# Patient Record
Sex: Male | Born: 1970 | Race: Black or African American | Hispanic: No | Marital: Single | Smoking: Never smoker
Health system: Southern US, Community
[De-identification: ages and names within clinical notes are randomized; demographics above are authoritative.]

## PROBLEM LIST (undated history)

## (undated) ENCOUNTER — Emergency Department: Admission: EM | Payer: Self-pay

## (undated) DIAGNOSIS — H209 Unspecified iridocyclitis: Secondary | ICD-10-CM

## (undated) DIAGNOSIS — A6929 Other conditions associated with Lyme disease: Secondary | ICD-10-CM

## (undated) DIAGNOSIS — M1990 Unspecified osteoarthritis, unspecified site: Secondary | ICD-10-CM

## (undated) HISTORY — PX: EYE SURGERY: SHX253

---

## 2006-07-20 ENCOUNTER — Emergency Department (HOSPITAL_COMMUNITY): Admission: EM | Admit: 2006-07-20 | Discharge: 2006-07-20 | Payer: Self-pay | Admitting: Emergency Medicine

## 2007-11-28 ENCOUNTER — Emergency Department (HOSPITAL_COMMUNITY): Admission: EM | Admit: 2007-11-28 | Discharge: 2007-11-28 | Payer: Self-pay | Admitting: Emergency Medicine

## 2009-07-15 ENCOUNTER — Emergency Department (HOSPITAL_COMMUNITY): Admission: EM | Admit: 2009-07-15 | Discharge: 2009-07-15 | Payer: Self-pay | Admitting: Emergency Medicine

## 2009-08-18 ENCOUNTER — Emergency Department (HOSPITAL_COMMUNITY): Admission: EM | Admit: 2009-08-18 | Discharge: 2009-08-18 | Payer: Self-pay | Admitting: Emergency Medicine

## 2010-12-30 LAB — DIFFERENTIAL
Basophils Relative: 1
Eosinophils Absolute: 0.3
Eosinophils Relative: 4
Monocytes Relative: 8
Neutrophils Relative %: 68

## 2010-12-30 LAB — CBC
MCHC: 33.9
MCV: 91.1
RBC: 4.24
RDW: 13.1

## 2010-12-30 LAB — BASIC METABOLIC PANEL
CO2: 29
Chloride: 104
GFR calc Af Amer: >60
Glucose, Bld: 78
Potassium: 3.5
Sodium: 139

## 2012-01-04 ENCOUNTER — Emergency Department (HOSPITAL_COMMUNITY)
Admission: EM | Admit: 2012-01-04 | Discharge: 2012-01-05 | Disposition: A | Discharge status: Home / Self Care | Payer: Self-pay | Attending: Emergency Medicine | Admitting: Emergency Medicine

## 2012-01-04 ENCOUNTER — Encounter (HOSPITAL_COMMUNITY): Payer: Self-pay | Admitting: *Deleted

## 2012-01-04 DIAGNOSIS — L02219 Cutaneous abscess of trunk, unspecified: Secondary | ICD-10-CM | POA: Insufficient documentation

## 2012-01-04 DIAGNOSIS — L02211 Cutaneous abscess of abdominal wall: Secondary | ICD-10-CM

## 2012-01-04 DIAGNOSIS — Z91013 Allergy to seafood: Secondary | ICD-10-CM | POA: Insufficient documentation

## 2012-01-04 DIAGNOSIS — L03119 Cellulitis of unspecified part of limb: Secondary | ICD-10-CM | POA: Insufficient documentation

## 2012-01-04 DIAGNOSIS — L02415 Cutaneous abscess of right lower limb: Secondary | ICD-10-CM

## 2012-01-04 DIAGNOSIS — L02419 Cutaneous abscess of limb, unspecified: Secondary | ICD-10-CM | POA: Insufficient documentation

## 2012-01-04 MED ORDER — SULFAMETHOXAZOLE-TMP DS 800-160 MG PO TABS
1.0000 | ORAL_TABLET | Freq: Once | ORAL | Status: AC
  Administered 2012-01-05: 1 via ORAL
  Filled 2012-01-04: qty 1

## 2012-01-04 MED ORDER — LIDOCAINE HCL (PF) 1 % IJ SOLN
10.0000 mL | Freq: Once | INTRAMUSCULAR | Status: AC
  Administered 2012-01-04: 5 mL via INTRADERMAL
  Filled 2012-01-04: qty 5

## 2012-01-04 MED ORDER — OXYCODONE-ACETAMINOPHEN 5-325 MG PO TABS
1.0000 | ORAL_TABLET | Freq: Once | ORAL | Status: AC
  Administered 2012-01-04: 1 via ORAL
  Filled 2012-01-04: qty 1

## 2012-01-04 MED ORDER — LIDOCAINE HCL (PF) 1 % IJ SOLN
INTRAMUSCULAR | Status: AC
  Administered 2012-01-04
  Filled 2012-01-04: qty 5

## 2012-01-04 NOTE — ED Notes (Signed)
States that he stayed in a

## 2012-01-04 NOTE — ED Notes (Signed)
Abscess to rt post thigh and to abd.

## 2012-01-04 NOTE — ED Provider Notes (Signed)
History     CSN: [id number]  Arrival date & time 01/04/12  2138   First MD Initiated Contact with Patient 01/04/12 2216      Chief Complaint  Patient presents with  . Abscess    (Consider location/radiation/quality/duration/timing/severity/associated sxs/prior treatment) HPI Comments: Patient c/o abscess to his right lower abdomen and right upper thigh that began several days ago.  Stats that he and his girlfriend recently stayed at a hotel prior to onset.  C/o pain , redness and swelling.  States he has tried to squeeze the one on his thigh w/o significant drainage.  He denies fever, chills, or vomiting  Patient is a 41 y.o. male presenting with abscess. The history is provided by the patient.  Abscess  This is a new problem. The current episode started less than one week ago. The onset was gradual. The problem occurs continuously. The problem has been gradually worsening. The abscess is present on the right upper leg and abdomen. The problem is moderate. The abscess is characterized by redness, painfulness and swelling. It is unknown what he was exposed to. The abscess first occurred at another residence. Pertinent negatives include no decrease in physical activity, no fever, no diarrhea, no vomiting and no decreased responsiveness. His past medical history is significant for skin abscesses in family. There were no sick contacts. He has received no recent medical care.    History reviewed. No pertinent past medical history.  History reviewed. No pertinent past surgical history.  History reviewed. No pertinent family history.  History  Substance Use Topics  . Smoking status: Never Smoker   . Smokeless tobacco: Not on file  . Alcohol Use: No      Review of Systems  Constitutional: Negative for fever, chills and decreased responsiveness.  Gastrointestinal: Negative for nausea, vomiting and diarrhea.  Musculoskeletal: Negative for joint swelling and arthralgias.  Skin: Positive  for color change.       Abscess   Hematological: Negative for adenopathy.  All other systems reviewed and are negative.    Allergies  Other and Shellfish allergy  Home Medications   Current Outpatient Rx  Name Route Sig Dispense Refill  . IBUPROFEN 200 MG PO TABS Oral Take 400-600 mg by mouth every 6 (six) hours as needed. For pain      BP 137/86  Pulse 86  Temp 98.6 F (37 C) (Oral)  Resp 18  Ht 5\' 7"  (1.702 m)  Wt 155 lb (70.308 kg)  BMI 24.28 kg/m2  SpO2 100%  Physical Exam  Nursing note and vitals reviewed. Constitutional: He is oriented to person, place, and time. He appears well-developed and well-nourished. No distress.  HENT:  Head: Normocephalic and atraumatic.  Cardiovascular: Normal rate, regular rhythm and normal heart sounds.   Pulmonary/Chest: Effort normal and breath sounds normal.  Abdominal: Soft. He exhibits no distension and no mass. There is no rebound and no guarding.       See skin exam  Musculoskeletal: Normal range of motion.  Neurological: He is alert and oriented to person, place, and time. He exhibits normal muscle tone. Coordination normal.  Skin: Skin is warm. There is erythema.          3 cm indurated abscess to the right medial thigh and 4 cm abscess to right lower abdominal wall.  Both have mild to moderate surrounding erythema    ED Course  Procedures (including critical care time)  Labs Reviewed - No data to display No results found.  MDM    INCISION AND DRAINAGE Performed by: Maxwell Caul. Consent: Verbal consent obtained. Risks and benefits: risks, benefits and alternatives were discussed Type: abscess  Body area: right lower abdomen  Anesthesia: local infiltration  Local anesthetic: lidocaine 1% w/o epinephrine  Anesthetic total: 3 ml  Complexity: complex Blunt dissection to break up loculations  Drainage: purulent  Drainage amount: moderate  Packing material: 1/4 in iodoform gauze  Patient  tolerance: Patient tolerated the procedure well with no immediate complications.    Abscess #2  INCISION AND DRAINAGE Performed by: Maxwell Caul. Consent: Verbal consent obtained. Risks and benefits: risks, benefits and alternatives were discussed Type: abscess  Body area: right thigh Anesthesia: local infiltration  Local anesthetic: lidocaine 1% w/o epinephrine  Anesthetic total: 3 ml  Complexity: complex Blunt dissection to break up loculations  Drainage: purulent  Drainage amount: moderate  Packing material: 1/4 in iodoform gauze  Patient tolerance: Patient tolerated the procedure well with no immediate complications.      Pt agrees to return here in 2 days for recheck and packing removal. First dose of abx given in ED.     The patient appears reasonably screened and/or stabilized for discharge and I doubt any other medical condition or other [facility] requiring further screening, evaluation, or treatment in the ED at this time prior to discharge.    Prescribed: Bactrim DS Percocet #15  Roy Tokarz L. Kidada Ging, [place] 01/05/12 0005

## 2012-01-05 MED ORDER — SULFAMETHOXAZOLE-TRIMETHOPRIM 800-160 MG PO TABS: 1.0000 | ORAL_TABLET | Freq: Two times a day (BID) | ORAL | Status: AC

## 2012-01-05 MED ORDER — OXYCODONE-ACETAMINOPHEN 5-325 MG PO TABS: 1.0000 | ORAL_TABLET | ORAL | Status: AC | PRN

## 2012-01-06 NOTE — ED Provider Notes (Signed)
Medical screening examination/treatment/procedure(s) were performed by non-physician practitioner and as supervising physician I was immediately available for consultation/collaboration.  Raeford Razor, MD 01/06/12 [phone]

## 2014-06-10 ENCOUNTER — Emergency Department
Admission: EM | Admit: 2014-06-10 | Discharge: 2014-06-10 | Disposition: A | Discharge status: Home / Self Care | Attending: Emergency Medicine | Admitting: Emergency Medicine

## 2014-06-10 ENCOUNTER — Encounter: Payer: Self-pay | Admitting: Emergency Medicine

## 2014-06-10 DIAGNOSIS — H22 Disorders of iris and ciliary body in diseases classified elsewhere: Secondary | ICD-10-CM | POA: Insufficient documentation

## 2014-06-10 DIAGNOSIS — H209 Unspecified iridocyclitis: Secondary | ICD-10-CM | POA: Insufficient documentation

## 2014-06-10 DIAGNOSIS — A6929 Other conditions associated with Lyme disease: Secondary | ICD-10-CM | POA: Insufficient documentation

## 2014-06-10 HISTORY — DX: Other conditions associated with Lyme disease: A69.29

## 2014-06-10 HISTORY — DX: Unspecified iridocyclitis: H20.9

## 2014-06-10 MED ORDER — ATROPINE 1 % EYE DROPS
1.0000 [drp] | Freq: Once | OPHTHALMIC | Status: AC
Start: 2014-06-10 — End: 2014-06-10
  Administered 2014-06-10: 1 [drp] via OPHTHALMIC
  Filled 2014-06-10: qty 5

## 2014-06-10 MED ORDER — ATROPINE 1% SUBLINGUAL DROPS (COMFORT CARE)
1.0000 [drp] | Freq: Two times a day (BID) | SUBLINGUAL | Status: AC
Start: 2014-06-10 — End: 2014-06-17

## 2014-06-10 MED ORDER — PREDNISOLONE ACETATE 1 % EYE DROPS,SUSPENSION
1.0000 [drp] | Freq: Once | OPHTHALMIC | Status: AC
Start: 2014-06-10 — End: 2014-06-10
  Administered 2014-06-10: 1 [drp] via OPHTHALMIC
  Filled 2014-06-10: qty 5

## 2014-06-10 MED ORDER — PREDNISOLONE ACETATE 1 % EYE DROPS,SUSPENSION
1.0000 [drp] | OPHTHALMIC | Status: AC
Start: 2014-06-10 — End: 2014-06-17

## 2014-06-10 NOTE — ED Triage Note (Signed)
OD blurry vision x2 wks, progressive,   Denies trauma  H/o lymes dx, iritis  +floaters/ha/light sensitivity

## 2014-06-10 NOTE — ED Initial Note (Signed)
EMERGENCY DEPARTMENT PHYSICIAN NOTE - Demarko Zeimet*       Date of Service:   06/10/2014  2:09 PM Patient's PCP: No primary care provider on file.   Note Started: 06/10/2014 14:36 DOB: [birth year]             Chief Complaint   Patient presents with    Vision Loss Full or Partial Field, Chronic           The history provided by the patient and police.  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 Associated symptoms include headache, floaters, photophobia and blurry vision.  No fever, chills, joint pain, chest pain, or abdominal pain.  In the past, patient has taken Prednisone for this issue.   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    Pt had bad reaction to prednisone in the past (gained 15#) and hopes to not be on it again.    A full history, including pertinent past medical, family and social history was reviewed.    HISTORY:  There are no hospital problems to display for this patient.   No Known Allergies   Past Medical History:    Lyme uveitis                                               No past surgical history on file.   Social History    Marital Status: SINGLE              Spouse Name:                       Years of Education:                 Number of children:               Occupational History    None on file    Social History Main Topics    Smoking Status: Not on file                       Smokeless Status: Not on file                       Alcohol Use: Not on file     Drug Use: Not on file     Sexual Activity: Not on file          Other Topics            Concern    None on file    Social History Narrative    None on file     No family history on file.             Review of Systems   Constitutional: Negative for fever and  chills.   Eyes: Positive for pain.        Positive floaters and blurry vision.   Cardiovascular: Negative for chest pain.   Gastrointestinal: Negative for abdominal pain.   Musculoskeletal: Negative for arthralgias.  Neurological: Positive for headaches.   All other systems reviewed and are negative.      TRIAGE VITAL SIGNS:  Temp: 36.7 C (98.1 F) (06/10/14 1406)  Temp src: Oral (06/10/14 1406)  Pulse: 84 (06/10/14 1406)  BP: (!) 140/102 mmHg (06/10/14 1406)  Resp: 16 (06/10/14 1406)  SpO2: 99 % (06/10/14 1406)  Weight: (not recorded)    Physical Exam   Constitutional: He is oriented to person, place, and time. He appears well-developed and well-nourished.   HENT:   Head: Normocephalic.   Right Ear: External ear normal.   Left Ear: External ear normal.   Nose: Nose normal.   Eyes: Right eye exhibits no discharge. Left eye exhibits no discharge.   R pupil approximately 5mm.  L pupil 3 mm.  Right minimally reactive to light.  R Anterior chamber hazy. No erythema.  No conjunctival injection.  No hyphema.   Neck: Neck supple. No JVD present. No tracheal deviation present.   Cardiovascular: Normal rate.    Pulmonary/Chest: Effort normal. No respiratory distress.   Abdominal: He exhibits no distension.   Musculoskeletal: Normal range of motion.   Neurological: He is alert and oriented to person, place, and time. He exhibits normal muscle tone.   Skin: Skin is warm and dry. No rash noted.   Psychiatric: He has a normal mood and affect. His behavior is normal.   Nursing note and vitals reviewed.          INITIAL ASSESSMENT & PLAN, MEDICAL DECISION MAKING, ED COURSE:  Bacilio Abascal* is a 44yr old male who presents with a chief complaint of right eye pain with floaters and blurry vision. 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 Differential diagnosis includes, but is not limited to traumatic iritis, rheumatic process, inflammatory or infectious process.  The patient is  hemodynamically stable and will require optho eval and predforte drops as an immediate intervention. Initial treatment and studies to evaluate this problem will include consultation.         ED Course Update: The patient was observed in the ED. The results of the ED evaluation were notable for the following:    Consults: A Consult was obtained from the Opthalmology service to evaluate for uveitis. They recommend predforte and atropine. 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  Consistent with lyme uveitis. Discussed with optho and will treat with predforte and atropine.  Ok for [employer] with optho follow up within 2 days.        LAST VITAL SIGNS:  Temp: 36.7 C (98.1 F) (06/10/14 1406)  Temp src: Oral (06/10/14 1406)  Pulse: 84 (06/10/14 1406)  BP: (!) 140/102 mmHg (06/10/14 1406)  Resp: 16 (06/10/14 1406)  SpO2: 99 % (06/10/14 1406)  Weight: (not recorded)      Clinical Impression: acute recurrent lyme uveitis      Anticipated work up needed: optho fu      Disposition: Discharge. Follow up with PCP. ED discharge instructions were reviewed and provided. It was discussed that radiology reports are preliminary and the patient will be contacted for any changes in interpretation.        MDM COMPLEXITY  Overall Complexity of MDM is: high          PRESENT ON ADMISSION:  Are any of the following  four conditions present or suspected on admission: decubitus ulcer, infection from an intravascular device, infection due to an indwelling catheter, surgical site infection or pneumonia? No.    PATIENT'S GENERAL CONDITION:  Good: Vital signs are stable and within normal limits. Patient is conscious and comfortable. Indicators are excellent.     SCRIBE STATEMENT  I, Earlene Platererek Clary, SCRIBE,  am personally taking down the notes in the presence of Dr. Levonne HubertJenny  Elric Tirado.  Electronically signed by - Earlene Platererek Clary, SCRIBE, Scribe    This report was electronically signed by myself, Levonne HubertJenny Edmund Holcomb MD, attending physician.

## 2014-06-10 NOTE — Discharge Instructions (Signed)
Please take the predforte drops: 1 drop every 2 ours while awake.  Take the atopine: 1-2 drops twice a day.  You must followup with an opthalmologist in 1-2 days for reevaluation.  Return for any increasing pain, fevers, new concerns

## 2019-12-24 ENCOUNTER — Emergency Department
Admission: EM | Admit: 2019-12-24 | Discharge: 2019-12-24 | Disposition: A | Discharge status: Home / Self Care | Payer: MEDICAID | Attending: Emergency Medicine | Admitting: Emergency Medicine

## 2019-12-24 ENCOUNTER — Emergency Department (EMERGENCY_DEPARTMENT_HOSPITAL): Payer: MEDICAID

## 2019-12-24 ENCOUNTER — Encounter: Payer: Self-pay | Admitting: EMERGENCY MEDICINE

## 2019-12-24 DIAGNOSIS — H209 Unspecified iridocyclitis: Secondary | ICD-10-CM

## 2019-12-24 DIAGNOSIS — H5712 Ocular pain, left eye: Secondary | ICD-10-CM

## 2019-12-24 DIAGNOSIS — H44113 Panuveitis, bilateral: Secondary | ICD-10-CM

## 2019-12-24 HISTORY — DX: Unspecified iridocyclitis: H20.9

## 2019-12-24 LAB — COMPREHENSIVE METABOLIC PANEL
Alanine Transferase (ALT): 15 U/L (ref 6–63)
Albumin: 3.8 g/dL (ref 3.4–4.8)
Alkaline Phosphatase (ALP): 59 U/L (ref 35–115)
Aspartate Transaminase (AST): 24 U/L (ref 15–43)
Bilirubin Total: 0.7 mg/dL (ref 0.3–1.3)
Calcium: 9.2 mg/dL (ref 8.6–10.5)
Carbon Dioxide Total: 26 mmol/L (ref 24–32)
Chloride: 101 mmol/L (ref 95–110)
Creatinine Serum: 1.01 mg/dL (ref 0.44–1.27)
E-GFR Creatinine (Male): 88 mL/min/{1.73_m2}
Glucose: 80 mg/dL (ref 70–99)
Potassium: 3.9 mmol/L (ref 3.3–5.0)
Protein: 7 g/dL (ref 6.3–8.3)
Sodium: 137 mmol/L (ref 135–145)
Urea Nitrogen, Blood (BUN): 13 mg/dL (ref 8–22)

## 2019-12-24 LAB — CBC WITH DIFFERENTIAL
Basophils % Auto: 0.5 %
Basophils Abs Auto: 0 10*3/uL (ref 0.0–0.2)
Eosinophils % Auto: 6.8 %
Eosinophils Abs Auto: 0.5 10*3/uL (ref 0.0–0.5)
Hematocrit: 42.4 % (ref 40.0–52.0)
Hemoglobin: 14.1 g/dL (ref 14.0–18.0)
Lymphocytes % Auto: 20.2 %
Lymphocytes Abs Auto: 1.4 10*3/uL (ref 1.0–4.8)
MCH: 30.1 pg (ref 27.0–33.0)
MCHC: 33.3 % (ref 32.0–36.0)
MCV: 90.3 fL (ref 80.0–100.0)
MPV: 8.9 fL (ref 6.8–10.0)
Monocytes % Auto: 7.4 %
Monocytes Abs Auto: 0.5 10*3/uL (ref 0.1–0.8)
Neutrophils % Auto: 65.1 %
Neutrophils Abs Auto: 4.5 10*3/uL (ref 1.8–7.7)
Platelet Count: 229 10*3/uL (ref 130–400)
RDW: 13.4 % (ref 0.0–14.7)
Red Blood Cell Count: 4.7 10*6/uL (ref 4.50–5.90)
White Blood Cell Count: 6.9 10*3/uL (ref 4.5–11.0)

## 2019-12-24 LAB — HIV AG/AB COMBO SCREEN: HIV Ag/Ab Combo Screen: NONREACTIVE

## 2019-12-24 LAB — ED HCV SCREEN WITH REFLEX: Hepatitis C Ab Screen: NONREACTIVE

## 2019-12-24 MED ORDER — PREDNISOLONE ACETATE 1 % EYE DROPS,SUSPENSION
1.0000 [drp] | OPHTHALMIC | 0 refills | Status: AC | PRN
Start: 2019-12-24 — End: 2020-01-23

## 2019-12-24 MED ORDER — CYCLOPENTOLATE 0.5 % EYE DROPS
1.0000 [drp] | Freq: Two times a day (BID) | OPHTHALMIC | 0 refills | Status: AC
Start: 2019-12-24 — End: 2020-01-23

## 2019-12-24 MED ORDER — TIMOLOL MALEATE 0.5 % EYE DROPS
1.0000 [drp] | Freq: Two times a day (BID) | OPHTHALMIC | 0 refills | Status: AC
Start: 2019-12-24 — End: 2020-02-18

## 2019-12-24 NOTE — Discharge Instructions (Signed)
You were evaluated and treated today for eye problems.   You were evaluated by the Ophthalmologist today, they recommend:  --Pred Forte eye drops as directed  --Timolol Eye drops as directed  --Cyclopentolate eye drops as directed  Please review  result with your Primary Doctor as it may require further testing and/or treatment.  Follow up with your Primary Doctor in 2 days.   If you are not able to arrange timely follow-up with a primary doctor yourself, please call [place] representative at [phone] during regular business hours for assistance in obtaining a follow-up appointment.  Return to ER for any worsening symptoms, including fevers, worsening pain, persistent vomiting, difficulty breathing, new rash, new/sudden vision changes, or other concerning symptoms.        Your Results this visit:   Please review with your Doctor as further testing/treatment may be indicated.  Labs Reviewed   CBC WITH DIFFERENTIAL   COMPREHENSIVE METABOLIC PANEL   SYPHILIS IGG/IGM AB WITH REFLEX   QUANTIFERON,TB GOLD PLUS   ANGIOTENSIN CONVERT ENZ (ACE) SENDOUT   HLA-B27 SENDOUT   BETA-2 MICROGLOBULIN,URINE SENDOUT   ED HCV SCREEN WITH REFLEX   COVID-2019 RNA, QUAL   HIV AG/AB COMBO SCREEN       Chest Xray  Results pending      Thank you for choosing [facility] for your emergency health care needs. It has been our privilege to take care of you today. Your primary complaints have been evaluated based on your history, lab tests, imaging tests and you have been treated for your symptoms and discharged home. Please take all medicines that are prescribed to you as directed (see below).  It is crucial to follow up with your Primary Doctor in the time frame recommended as many health conditions that seem self-limited initially may actually worsen over time.  If you do not have a primary care physician, we will outline the various resources available for you to find one.    If at any time you  feel that your condition is worsening, call your doctor or return to the Emergency Department for reevaluation. CALL 911 IF YOU THINK YOU ARE HAVING A MEDICAL EMERGENCY. Return to the Emergency Department if you are unable to obtain the recommended follow-up treatment or you are not better as expected. You can call the [facility] with questions at [phone].    Please realize that the results of some studies that you had done during your stay with [place] (such as x-rays and cultures) have only preliminarily results at this time.  Results of these studies may change as more information becomes available or as the studies are re-evaluated by other members of our health care team in the next few days. We will attempt to contact you with any important changes or additions to the studies that were obtained today, particularly if any of these results require a change in your treatment.    It is your responsibility to review any and all studies performed this visit with your Primary Doctor as soon as possible, as they may provide additional important information regarding your health. If your Primary doctor if outside of the [facility] system it is recommended you obtain your records through the Medical Records Department (phone: [phone]).        GENERAL PAIN MEDICATION PRECAUTIONS    You may have been prescribed medications for pain today. Some of these may contain a narcotic (Vicodin, Norco, Percocet, etc.) Take these pain  medications as prescribed. You also may have been prescribed a muscle relaxant or anxiolytic (benzodiazepine) such as valium (diazepam) or ativan (lorazepam). Do not drink alcohol, drive, or operate heavy machinery while taking either narcotic or benzodiazepine medications. All narcotics and benzodiazepines have a risk of dependence, so please use with caution. If you were prescribed Vicodin, Norco, or Percocet, do not take additional products containing Tylenol (acetaminophen), as this can  cause an acetaminophen overdose and can damage your liver. Do not take more than 4061m of acetaminophen daily.    You may also have been prescribed an anti-inflammatory pain medication (NSAID) such as ibuprofen (Motrin, Advil) or naproxen (Aleve). Take the NSAID as prescribed, with food or milk.  Stop taking the NSAID if you develop abdominal pain, vomiting blood or dark/tarry or bloody stools. Be sure to drink plenty of fluids, at least 1-2 liters of water per day while taking an NSAID unless you have congestive heart failure or other condition that requires you to limit your water intake.    You may fill any prescriptions at a pharmacy of your choice. Please take these as directed.      =================================================    [facility]  [address] of [place]  Clinic Address: 1st Floor, [address]  Enrollment: 2nd Floor, [place] [address] Clinic Telephone: [phone]   If you do NOT have any health insurance or have Emergency Medi-Cal only, you may qualify for the [facility]"Healthy Partners" Program to get primary care. Follow the same instructions above and ask to sign up for the Healthy Partners program.      You can call your managed care program to help find a different clinic.     Managed Care Program phone numbers:  -[place]: [phone] [phone] - [place]: [phone] - Health Net: 8[place] [phone]   Here is a list of local Medi-Cal or low-cost clinics other than the [facility] Call the clinic to check what type of insurance they accept:    WK[place]  Call Center: 9[place] --> If you are undocumented and have Emergency Medi-Cal only or are uninsured, you can be seen here in the Immediate (Urgent) Care Clinic.  [address] [place] [address] Phone:  9[place] [address], [address] [phone] [phone] [address] [place] [id number] [phone]. [place] [phone]  All clinics open 8 am - 5:30 pm for appointments only.  [address] [place] [phone]  [address] [place] [phone]  [address], [place] [phone]  [place]ie [address] [place] [phone]  [address] [phone]  [address] [place] [address] [address] [address], [place] [phone]  [address] [place] [phone]    Health For All Clinics:  These clinics do NOT prescribe pain  medications.  [facility]  [address], [place], [address] [phone] or [phone]  [address] [place], [place], [address] [phone]  [facility]  [address], [place], [place], [address] [phone]    [facility]  [address] [place], [place], [address] [phone]  M-F 8:30am-12 and 1:30pm -4:30pm    [facility] Urgent [facility]  [address] [place], [place], [address] [phone]  M-F 9am-9pm  Weekends and holidays 9am-5pm  Walk in urgent care only, no primary care assignment available    [facility] [phone]  [address] [place], [place]  [place], [place] [address]  [facility]  [place],  [place] [address]  [place], [place] [address]  [facility]  [address]  [place], [place] [address]  [phone]    =============================================    HOW TO APPLY FOR MEDI-CAL INSURANCE    You can apply for Medi-Cal in person at the following locations:    1. [address],  [place], [place] [address]  [facility]: [phone]  Lobby Hours: Monday - Friday, 8:00am to 4:00pm    Program(s):  CalFresh &Medi-Cal [place] only  General Assistance  Job Talk Session (every Wednesday at 1:30pm)    2. [address], [place] [address]  [place]: [phone]  Hours of Operation: Monday - Friday, 8:00am to 5:00pm    Program(s):  CalFresh and Medi-Cal (Continuing) [place] only.  Documents can be put in a drop box in the parking lot.    For assistance with applying by phone or mailor to request a new Medi-Cal card,you can contact the [place] at [phone] [facility] only)      [facility] 24 hour Pharmacy Services    [facility]  [address] [place] (will fill [place] after hours)  [address], [facility]   [phone]    [facility]  [address] [place] [address]  [phone]     [facility] (7am-11pm)  [address], [place] [address]   [place] for [facility], [address] [place], [place]  [phone], [address], [place], [place] [phone] (Sunday 8a-2p)    (Last updated 08/16/15)

## 2019-12-24 NOTE — Allied Health Progress (Signed)
ED IN-HOUSE NOTE: HEALTH NAVIGATOR ASSESSMENT:     Demographics     Patient Name  Terry Stuart    MRN  [id number]    Date of Visit  12/24/2019     Primary Service  Emergency Medicine    Payor  Payor: BLUE CROSS [place] / Plan: BLUE CROSS/[employer] / Product Type: *No Product type* /      1. Referral Source:Epic Consult  2. If ED consult, is this consult assigned as "Urgent" or "Routine" by referring provider?   Routine  3. Concerns/needs/requests for Health Navigator assistance include: Coordinate follow-up appointments  4. Registration/Insurance confirmed:Yes  5. Refer to external health navigator? no  6.   MyChart registration confirmed: yes  7.   Interpreter needed: NO If so, language of choice: English            Referral [place]/  External? Date   Scheduled Appt Location/  Provider Name Outcome Date Completed  (if applicable)   PCP             MD Specialist 1                  Substance Use [place]                  Other                    1. Were you able to contact the patient?  yes If not, state the reason: N/A (Patient Contacted)  2. Reviewed reason for referral to [employer].  Obtained patient verbal agreement to         participate in program. Discussed the right to decline services at any time and understanding that there will be follow-up by [facility] Navigator.  3. Preferred days/times for outreach: UNSPECIFIED  4. Best phone number verified as: [phone] OK to leave detailed message at this phone number?  Unknown  5. Patient utilizes MyChart for communication: no  6. Any In-House Referrals Needed?  a. N/A  7. [employer] Performed (details included such as follow ups that are needed):  a. N/A    HN met with pt whom expressed will be flying out to [facility] and will most likely be gone for the next four months. Pt has an ophthalmologist in [place] and can follow up there. HN provided pt with pcp information. Pt will be  scheduling appointment with new pcp and asking for new optho referral. No further HN assistance needed at this time; HN ending episode.    I spoke with the patient for  40-50 minutes and spent 10-20 minutes following up on the notes items.     Electronically signed by:  Alinda Deem, [place] Navigator Program

## 2019-12-24 NOTE — Consults (Signed)
Met with Patient per Consult order.  Reference Allied Health Progress Note.    Electronically signed by:  Luisa Ayala Naranjo, Health Services Navigator  TOC Health Navigator    Transitions of Care   Health Navigator Program  [phone]

## 2019-12-24 NOTE — ED Triage Note (Signed)
Pt ambulated to triage, reports recent change in doctors and here to establish care with opthomolgy, reports needs new eye drops. Denies any new c/o.  Reports has light sensitivity and blurred vision d/t being out of drops.
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
## 2019-12-24 NOTE — ED Provider Notes (Signed)
EMERGENCY DEPARTMENT PHYSICIAN NOTE - Terry Stuart         Date of Service:   12/24/2019  1:17 PM Patient's PCP: Patient, No Pcp Per   Note Started: 12/24/2019 15:16 DOB: 10-21-1970         Chief Complaint   Patient presents with    Eye Problem     med scrip refill          The history provided by the patient.  Interpreter used: No    Terry Stuart is a 49yr old male, who  has a past medical history of Iritis of left eye., presenting to the ED with a chief complaint of L eye pain this is dull, constant, progressive, without alleviating or exacerbating factors and redness that began in the last 24 hours. This feels similar to his previous flares of iritis. He normally takes steroids and other medications but ran out of his eyedrops and his old ophthalmologist retired; he is attempting to establish care here. No trauma. No fevers, chills, vomiting, headache. No other complaints.     A full history, including past medical, social, and family history (as detailed in this note), was reviewed and updated as necessary.      HISTORY:    Past Medical History    Iritis of left eye    Allergies   Allergen Reactions    Seafood Hives      No past surgical history on file. No current outpatient medications on file.   Social History     Tobacco Use    Smoking status: Not on file   Substance Use Topics    Alcohol use: Not on file    Drug use: Not on file     Social History     Social History Narrative    Not on file    No family history on file.        Review of Systems   Constitutional: Negative for activity change and fever.   HENT: Negative for rhinorrhea and sore throat.    Eyes: Positive for pain and redness. Negative for visual disturbance.   Respiratory: Negative for cough and shortness of breath.    Cardiovascular: Negative for chest pain and leg swelling.   Gastrointestinal: Negative for abdominal distention, abdominal pain, constipation, diarrhea and nausea.   Genitourinary: Negative for dysuria and  hematuria.   Musculoskeletal: Negative for arthralgias, back pain, neck pain and neck stiffness.   Skin: Negative for rash.   Neurological: Negative for dizziness, light-headedness and headaches.   Psychiatric/Behavioral: Negative for agitation and dysphoric mood.          TRIAGE VITAL SIGNS:  Temp: 36.5 C (97.7 F) (12/24/19 1319)  Temp src: Oral (12/24/19 1319)  Pulse: 68 (12/24/19 1319)  BP: (!) 145/94 (12/24/19 1319)  Resp: 20 (12/24/19 1319)  SpO2: 99 % (12/24/19 1319)  Weight: 77.6 kg (171 lb 1.2 oz) (12/24/19 1319)    Physical Exam  Vitals and nursing note reviewed.   Constitutional:       Appearance: He is not ill-appearing.   HENT:      Head: Normocephalic and atraumatic.      Right Ear: External ear normal.      Left Ear: External ear normal.      Nose: Nose normal.   Eyes:      Comments: Right eye: Normal extraocular movements, pupils 4 mm and reactive, no scleral injection  Left eye: Normal extraocular movements, pupil 3 mm and slightly misshapened, injected  sclera   Cardiovascular:      Rate and Rhythm: Normal rate and regular rhythm.   Pulmonary:      Effort: Pulmonary effort is normal. No respiratory distress.   Abdominal:      General: There is no distension.      Palpations: Abdomen is soft.      Tenderness: There is no abdominal tenderness.   Musculoskeletal:      Right lower leg: No edema.      Left lower leg: No edema.   Skin:     General: Skin is warm and dry.   Neurological:      General: No focal deficit present.      Mental Status: He is alert and oriented to person, place, and time.   Psychiatric:         Mood and Affect: Mood normal.              INITIAL ASSESSMENT & PLAN, MEDICAL DECISION MAKING, ED COURSE  Terry Stuart is a 38yr male who presents with a chief complaint of L eye pain.     Differential includes, but is not limited to: uveitis, iritis, trauma     The results of the ED evaluation were notable for the following:     Consults:  [place]  Reason for consult: L eye pain        Chart Review: I reviewed the patient's prior medical records. Pertinent information that is relevant to this encounter: ER visit from [place] from 06/10/2014          PATIENT SUMMARY:   Terry Stuart is a 49yr old male, who  has a past medical history of Iritis of left eye., presenting to the ED with a chief complaint of L eye pain this is dull, constant, progressive, without alleviating or exacerbating factors and redness that began in the last 24 hours. Similar to prior iritis flares.    Initial VS unremarkable and reassuring.  Eye exam as above.    Patient with previously documented anisocoria back in 2016.     Ophthalmology consulted.  They ordered a full panel of labs including beta-2 microglobulin, ACE, syphilis, CBC, CMP, quantiferon gold, HLA-B27. Ophtho requests followup in Ophtho Uveitis clinic.    Chest x-ray without acute disease.    Given prescriptions for Pred forte, timolol and cyclopentolate drops.    ED health navigator consulted to arrange for ophthalmology (uveitis) follow-up. Of note, pt stating he is going to travel back to [place] imminently and is not sure when he will return here, understands to see ophtho as soon as he gets there for ongoing care. Call Optho clinic when he returns to [facility] area to re-establish here when needed.             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 Follow up with ophthalmology. ED discharge instructions were reviewed and provided.      PATIENT'S GENERAL CONDITION:  Fair: Vital signs are stable and within normal limits. Patient is conscious but may be uncomfortable. Indicators are favorable.    Electronically  signed by:  Sedonia Small, MD, Resident              A medical screening exam was performed.    This patient was seen, evaluated, and care plan was developed with the resident.  I agree with the findings and plan as outlined in our combined note.    Terry Cooks, MD      Electronically signed by: Terry Cooks, MD, Attending Physician

## 2019-12-25 LAB — COVID-2019 RNA, QUAL: SARS-CoV-2: NOT DETECTED

## 2019-12-25 LAB — SYPHILIS IGG/IGM AB WITH REFLEX: Syphilis IgG/IgM Ab with Reflex: NONREACTIVE

## 2019-12-26 LAB — QUANTIFERON,TB GOLD PLUS
Mit - Nil: 8.81 IU/mL
Nil: 0.01 IU/mL
QFT (Qual): NEGATIVE
TB1 - Nil: 0 IU/mL
TB2 - Nil: 0 IU/mL

## 2019-12-26 LAB — ANGIOTENSIN CONVERT ENZ (ACE) SENDOUT: Angiotensin Convert Enz (ACE): 38 U/L (ref 9–67)

## 2019-12-27 LAB — BETA-2 MICROGLOBULIN,URINE SENDOUT
BETA-2 MICROGLOB URINE ug/gCRT: 41 ug/g CRT (ref 0–300)
BETA-2 MICROGLOBULIN,URINE: 37 ug/L (ref 0–300)
CREATININE,UR PER VOLUME: 89 mg/dL
pH METABOLIC SCREEN: 7

## 2019-12-27 LAB — HLA-B27 SENDOUT: HLA-B27: POSITIVE — AB

## 2020-01-09 ENCOUNTER — Other Ambulatory Visit: Payer: Self-pay | Admitting: Emergency Medicine

## 2020-02-04 ENCOUNTER — Other Ambulatory Visit: Payer: Self-pay | Admitting: Emergency Medicine

## 2020-02-11 ENCOUNTER — Other Ambulatory Visit: Payer: Self-pay | Admitting: Emergency Medicine

## 2020-02-18 ENCOUNTER — Ambulatory Visit: Payer: MEDICAID | Attending: OPHTHALMOLOGY | Admitting: OPHTHALMOLOGY

## 2020-02-18 DIAGNOSIS — H44113 Panuveitis, bilateral: Secondary | ICD-10-CM | POA: Insufficient documentation

## 2020-02-18 DIAGNOSIS — H40003 Preglaucoma, unspecified, bilateral: Secondary | ICD-10-CM

## 2020-02-18 DIAGNOSIS — Z961 Presence of intraocular lens: Secondary | ICD-10-CM

## 2020-02-18 MED ORDER — PREDNISOLONE ACETATE 1 % EYE DROPS,SUSPENSION
1.0000 [drp] | Freq: Every day | OPHTHALMIC | 5 refills | Status: DC
Start: 2020-02-18 — End: 2020-02-19

## 2020-02-18 MED ORDER — PREDNISONE 10 MG TABLET
10.0000 mg | ORAL_TABLET | Freq: Every day | ORAL | 0 refills | Status: DC
Start: 2020-02-18 — End: 2020-02-19
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
## 2020-02-18 NOTE — Progress Notes (Signed)
Fluorescein Angiography Nursing Note:  Procedure consent obtained and patient review of potential side effects of nausea, dizziness, itching, pain, urine and skin discoloration by  Dr. Bufford Buttner prior to procedure. Patient's diabetic, blood pressure, cardiac, allergy status reviewed by nurse. Pre angio VS @ 12:25 PM with L cuff, L arm.  BP 117/112 P 60, R 16. Site cleansed prior to insertion. 500mg /66ml of Fluorescein given IVP via 25g butterfly to LAC vein. Initially, good blood return noted and flushed with 1 ml normal saline. Flushed with NS 9 ml post infusion. Dry dressing to area after pressure applied. Patient tolerated procedure well. Denied SOB, pain, faintness, itching, nausea. Post vitals @  BP 137/96, P 66, R 16. Pt ambulated post procedure with no complaints when asked.      4m, RN

## 2020-02-18 NOTE — Nursing Note (Signed)
Patient was wearing a surgical mask.  Droplet precautions were followed when caring for the patient.   PPE used by me during encounter: Surgical mask     Coady Train, HUS[facility]

## 2020-02-18 NOTE — Progress Notes (Deleted)
HPI 02/18/2020:    Terry Stuart is a pleasant 49yr old male here for first visit with me =    Past Medical History:   Diagnosis Date    Iritis of left eye     chronic, recurrent       ROS:  Gen: No acute distress    Base Eye Exam     Visual Acuity (Snellen - Linear)       Right Left    Dist sc 20/50 -2 20/70    Dist ph sc 20/25 -2 20/50          Tonometry ([facility], 10:31 AM)       Right Left    Pressure 11 10          Pupils       Dark Shape React    Right 3  +    Left 3 Irregular +          Visual Fields       Right Left     Full Full          Extraocular Movement       Right Left     Full Full          Neuro/Psych     Oriented x3: Yes    Mood/Affect: Normal          Dilation     Both eyes: 1.0% Mydriacyl, 2.5% Phenylephrine @ 10:31 AM   Sunglasses were offered to patient today but declined.  Patient states brought sunglasses.  Terry Stuart HLT I                   Data Reviewed:  OCT macula 02/18/2020:  ***    Injection schedule  OD or [id number]     Date    Time since last      Status           Med      ASSESSMENT:  ***    PLAN:  ***

## 2020-02-18 NOTE — Patient Instructions (Addendum)
Drops:  - Continue Dorzolamide three times daily in the left eye  - Continue Timolol daily in both eyes   - Continue Pred Forte (prednisolone acetate 1%) 6 times daily in both eyes    Pills:  - Start oral prednisone (10 mg tablets). Take 60 mg (6 pills) daily for 1 week, then 50 mg (5 pills) daily for 1 week, then 40 mg (4 pills) daily until follow-up    Follow up in 4 weeks    ---------------------------------------------------------------------------------------------    INFORMATION ABOUT PREDNISONE    Prednisone is a steroid pill and it usually works very well to control inflammation in the eye. However, it can have certain side effects, especially when used for many months. These side effects are more likely to happen with high doses (40mg  a day or more) and less likely to happen with low doses (10mg  a day or less). Here is a list of possible side effects:    -hypertension (increase in blood pressure)  -hyperglycemia (increase in blood glucose. If you have diabetes, please make sure you let your primary care doctor or endocrinologist know right away that you are using oral prednisone, as you will likely need to monitor your blood glucose more closely, and you may need to adjust your diabetes medications)  -mood swings  -trouble sleeping  -headache  -weight gain  -fluid retention  -stomach ulcers (please take an antacid with oral prednisone to prevent ulcers from happening)  -damage to the hip joint. This is a very rare but also a potentially devastating complication. If you notice severe hip pain, please let me know right away. We will likely have to rapidly reduce your oral prednisone dose in as safe a manner as possible. If for some reason you are unable to get a hold of me or your primary care doctor, you need to come to the emergency department if you develop severe hip pain while on oral prednisone).   -osteoporosis (please take calcium and vitamin D with oral prednisone to prevent osteoporosis)    Keep in  mind you are at increased risk of infection while on oral prednisone, so make sure you are washing your hands regularly and that you stay away from sick people. If you feel sick while on oral prednisone, please let me know right away.     When taken for more than 4 weeks, your body gets used to having prednisone and your adrenal glands, which are right above your kidneys, may stop making your body's own natural steroid. This is ok while you're on the prednisone, but if you stop prednisone suddenly, this may shock your body because it will take the adrenal glands time to start making its own natural steroid again. You may experience symptoms of "adrenal insufficiency" if you stop prednisone suddenly after taking it for more than 4 weeks. Symptoms of adrenal insufficiency are fatigue, nausea, vomiting, abdominal pain, weakness, confusion, fever, and even coma. These symptoms can be severe. If you take prednisone for more than 4 more weeks, you must taper off the medication slowly. I will provide you with instructions for a slow taper when that time comes. For now, please do not stop prednisone suddenly without discussing with me first. Generally, adrenal sufficiency does not happen if you take oral prednisone for less than 4 weeks.

## 2020-02-18 NOTE — Progress Notes (Signed)
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    Mr. WCollierstates he has been dealing with ocular problems off and on since around 20 years ago. He initially got a piece of glass in his eye in 1997 which required topical steroids. The steroids reportedly caused cataract and glaucoma for which he received extraction and an iStent. The history of the left eye is unclear, but he also developed inflammation at some point in this eye as well. Both eyes have undergone vitrectomy. He has required oral prednisone perhaps twice in the past.    He uses Pred Forte (prednisolone acetate 1%) and timolol off and on for flares. These usually manifest as redness, pain, foreign body sensation, and photophobia with later blurred vision.    He has been told many things in the past, including that he had Lyme disease or sarcoidosis, but neither of these diagnoses were ultimately confirmed.    Uveitis ROS 02/18/2020:   Gen: No acute distress   Oral ulcers: No   Genital ulcers: No   Cold sores: No   Ringing in ears or hearing loss: No   Weight loss or gain: No   Numbness, tingling, weakness in extremities: No   Loss of bowel or bladder function: No   Diarrhea or blood in stool: Yes - blood in the stool, was told hemorrhoids   Joint pains: Yes - gout in the toes, attributes these to playing many sports with repetitive minor injuries (knee, ankle, shoulder)   Skin rashes: No   Cats or dogs at home: Yes - dogs   Recent foreign travel: No   Sexually transmitted infection: No    Past Medical History:  Past Medical History:   Diagnosis Date    Iritis of left eye     chronic, recurrent       Medications:  Current Outpatient Medications   Medication Sig Dispense Refill    dorzolamide HCl (DORZOLAMIDE OPHTH) Instill into the affected eye(s). One drop TID OS      Prednisolone Acetate (PRED FORTE, ECONOPRED PLUS) 1% Ophthalmic Suspension Instill 1  drop into EACH eye six times daily. 10 mL 5    PredniSONE (DELTASONE) 10 mg Tablet Take 1 tablet by mouth every morning with a meal. Take 60 mg (6 pills) daily for 1 week, then 50 mg (5 pills) daily for 1 week, then 40 mg (4 pills) daily until follow-up 200 tablet 0    Timolol (TIMOPTIC) 0.5% Ophthalmic Solution Instill 1 drop into EACH eye 2 times daily. 5 mL 0     No current facility-administered medications for this visit.       Ocular meds (drops and systemic):  Pred Forte (prednisolone acetate 1%) daily OU  Dorzolamide TID OS  Timolol daily OU    Allergies:  Allergies   Allergen Reactions    Seafood Hives        Family history of uveitis: No    Social history:  Social History     Socioeconomic History    Marital status: SINGLE     Spouse name: Not on file    Number of children: Not on file    Years of education: Not on file    Highest education level: Not on file   Occupational History    Not on file   Tobacco Use    Smoking status: Not on file    Smokeless tobacco:  Not on file   Substance and Sexual Activity    Alcohol use: Not on file    Drug use: Not on file    Sexual activity: Not on file       Syphilis/TB labs:  12/24/19 negative Quant gold, syphilis    Pre-IMT labs:  Lab Results   Component Value Date    WBC 6.9 12/24/2019    RBC 4.70 12/24/2019    HGB 14.1 12/24/2019    HCT 42.4 12/24/2019    MCV 90.3 12/24/2019    MCH 30.1 12/24/2019    MCHC 33.3 12/24/2019    RDW 13.4 12/24/2019    MPV 8.9 12/24/2019    PLT 229 12/24/2019       Hepatitis C Ab Screen   Date Value Ref Range Status   12/24/2019 Nonreactive Nonreactive Final     Aspartate Transaminase (AST)   Date Value Ref Range Status   12/24/2019 24 15 - 43 U/L Final     Alanine Transferase (ALT)   Date Value Ref Range Status   12/24/2019 15 6 - 63 U/L Final     Urea Nitrogen, Blood (BUN)   Date Value Ref Range Status   12/24/2019 13 8 - 22 mg/dL Final     Creatinine Serum   Date Value Ref Range Status   12/24/2019 1.01 0.44 - 1.27 mg/dL  Final       IMT monitoring labs:  Lab Results   Component Value Date    WBC 6.9 12/24/2019    RBC 4.70 12/24/2019    HGB 14.1 12/24/2019    HCT 42.4 12/24/2019    MCV 90.3 12/24/2019    MCH 30.1 12/24/2019    MCHC 33.3 12/24/2019    RDW 13.4 12/24/2019    MPV 8.9 12/24/2019    PLT 229 12/24/2019       Aspartate Transaminase (AST)   Date Value Ref Range Status   12/24/2019 24 15 - 43 U/L Final     Alanine Transferase (ALT)   Date Value Ref Range Status   12/24/2019 15 6 - 63 U/L Final     Urea Nitrogen, Blood (BUN)   Date Value Ref Range Status   12/24/2019 13 8 - 22 mg/dL Final     Creatinine Serum   Date Value Ref Range Status   12/24/2019 1.01 0.44 - 1.27 mg/dL Final       Eye Exam:  Base Eye Exam     Visual Acuity (Snellen - Linear)       Right Left    Dist sc 20/50 -2 20/70    Dist ph sc 20/25 -2 20/50          Tonometry (iCare, 10:31 AM)       Right Left    Pressure 11 10          Pupils       Dark Shape React    Right 3  +    Left 3 Irregular +          Visual Fields       Right Left     Full Full          Extraocular Movement       Right Left     Full Full          Neuro/Psych     Oriented x3: Yes    Mood/Affect: Normal          Dilation     Both   eyes: 1.0% Mydriacyl, 2.5% Phenylephrine @ 10:31 AM   Sunglasses were offered to patient today but declined.  Patient states brought sunglasses.  Christine Chang HLT I               Slit Lamp and Fundus Exam     External Exam       Right Left    External Normal Normal          Slit Lamp Exam       Right Left    Lids/Lashes Normal Normal    Conjunctiva/Sclera White and Quiet White and Quiet    Cornea Clear corneal incision, no KP Clear corneal incision, faint fine KP    Anterior Chamber 2+ cell, fluorescin flare 2+ cell, fluorescin flare    Iris Few TIDs, pharm dilated, no nodules Few TIDs, pharm dilated, no nodules    Lens Posterior chamber intraocular lens Posterior chamber intraocular lens    Vitreous s/p vitrectomy, 2+ cell s/p vitrectomy, 2+ cell          Fundus  Exam       Right Left    Disc cupping cupping    C/D Ratio 0.8 0.8    Macula attached attached    Vessels perivascular sclerosis and exudation perivascular sclerosis and exudation    Periphery attached attached              Data Reviewed:    OCT macula 02/18/2020:  OD: ERM, CME, mild  OS: ERM, CME, center-involving, worse than OD    Optos FA 02/18/20:  OU: Diffuse retinal leakage, disc leakage, macular leakage, and vasculitis    Workup labs 12/24/19:  Negative: HIV, HCV, ACE, Quant Gold, Syphilis IgG/IgM, urine B2 microglobulin    Positive: HLA-B27    Chest XR: 12/24/19  IMPRESSION:  1. No acute cardiopulmonary disease    ASSESSMENT:  1. 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 Pseudophakia OU    3. 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 Discs with cupping. IOP seems well controlled on current regimen.    PLAN:  - Start oral prednisone (10 mg tablets). Take 60 mg (6 pills) daily for 1 week, then 50 mg (5 pills) daily for 1 week, then 40 mg (4 pills) daily until next visit, with plans to switch to steroid-sparing therapy   - Referral to Rheumatology to consider initiation of steroid-sparing immunomodulatory therapy given diagnosis of recurrent noninfectious uveitis. I would suggest methotrexate or mycophenolate as first options.  - Continue Pred Forte (prednisolone acetate 1%) 6 times daily OU  - Continue Dorzolamide three times daily OS  - Continue Timolol daily OU  - Refer to glaucoma service to establish care    - Follow-up in 4 weeks with OCT OU, Optos FA OU. He is on parole and is allowed to travel for the holidays. He is in and out of [place] for the next month and may need to  reschedule his appointment. He states it is possible he may be seen at [facility] while out of state (he will be in [place]).    I confirm that I reviewed the patient's medical history and other pertinent data,   and personally examined the patient with the ophthalmic exam findings documented here.     I agree with the exam findings, treatment plan, and medical decision making reported by the resident or fellow, with any exceptions as noted above.    SCRIBE DISCLAIMER:   This note was scribed by Burt Knack, SCRIBE, a trained medical scribe in the presence of Dr. Donnita Falls, M.D.  Electronically signed by Burt Knack, SCRIBE, 02/18/2020 1:58 PM    PROVIDER DISCLAIMER:   This document serves as my personal record of services taken in my presence. It was created on 02/18/2020 on my behalf by the trained medical scribe listed above.     I have reviewed this document and agree that this note accurately reflects the history and exam findings, the patient care provided, and my medical decision making.     Donnita Falls, MD

## 2020-02-19 ENCOUNTER — Telehealth: Payer: Self-pay | Admitting: OPHTHALMOLOGY

## 2020-02-19 MED ORDER — FLUORESCEIN 500 MG/5 ML (10 %) INTRAVENOUS SOLUTION
5.0000 mL | INTRAVENOUS | Status: AC | PRN
Start: 2020-02-19 — End: 2020-02-19
  Administered 2020-02-19: 14:00:00 5 mL via INTRAVENOUS

## 2020-02-19 MED ORDER — PREDNISOLONE ACETATE 1 % EYE DROPS,SUSPENSION
1.0000 [drp] | Freq: Every day | OPHTHALMIC | 5 refills | Status: DC
Start: 2020-02-19 — End: 2020-03-17

## 2020-02-19 MED ORDER — PREDNISONE 10 MG TABLET
10.0000 mg | ORAL_TABLET | Freq: Every day | ORAL | 0 refills | Status: DC
Start: 2020-02-19 — End: 2020-03-17

## 2020-02-19 NOTE — Telephone Encounter (Signed)
Done.    Terry Brueggemann H. Chevy Sweigert, MD  Ophthalmology PGY-4  Pager: [phone]  PI: [id number]

## 2020-02-19 NOTE — Telephone Encounter (Signed)
Spoke to patient. Could you please send the Prednisone 10mg  tablets and Pred Forte eyedrops to pharmacy below? Patient is traveling to [place] and was not able to pick up these medications yesterday.    [facility]  [address] [place], [place]  PH: [phone]    [phone]  [facility]  [phone]

## 2020-02-19 NOTE — Telephone Encounter (Signed)
Attempted to call patient back but mailbox is full. Unable to leave voicemail.     Patient left me a message regarding Prednisone 10mg  and Pred Forte eyedrops. [facility] on [address] did not receive medications yesterday (although, there is a receipt confirmed by pharmacy this was received).    Patient would like these medications called into a [facility] in [place], [place]. Patient is flying out to [place] today. I will attempt to contact patient again as there are several [facility] in [place].    Thanks,    [place]  [facility]  [phone]

## 2020-03-17 ENCOUNTER — Ambulatory Visit: Payer: MEDICAID

## 2020-03-17 ENCOUNTER — Ambulatory Visit: Payer: MEDICAID | Attending: OPHTHALMOLOGY | Admitting: OPHTHALMOLOGY

## 2020-03-17 DIAGNOSIS — H44113 Panuveitis, bilateral: Secondary | ICD-10-CM

## 2020-03-17 DIAGNOSIS — H35373 Puckering of macula, bilateral: Secondary | ICD-10-CM | POA: Insufficient documentation

## 2020-03-17 DIAGNOSIS — H35352 Cystoid macular degeneration, left eye: Secondary | ICD-10-CM | POA: Insufficient documentation

## 2020-03-17 LAB — CBC WITH DIFFERENTIAL
Basophils % Auto: 0.1 %
Basophils Abs Auto: 0 10*3/uL (ref 0.0–0.2)
Eosinophils % Auto: 0.1 %
Eosinophils Abs Auto: 0 10*3/uL (ref 0.0–0.5)
Hematocrit: 43.4 % (ref 40.0–52.0)
Hemoglobin: 14.3 g/dL (ref 14.0–18.0)
Lymphocytes % Auto: 4.4 %
Lymphocytes Abs Auto: 0.3 10*3/uL — ABNORMAL LOW (ref 1.0–4.8)
MCH: 31.2 pg (ref 27.0–33.0)
MCHC: 32.9 % (ref 32.0–36.0)
MCV: 95 fL (ref 80.0–100.0)
MPV: 8.8 fL (ref 6.8–10.0)
Monocytes % Auto: 2.8 %
Monocytes Abs Auto: 0.2 10*3/uL (ref 0.1–0.8)
Neutrophils % Auto: 92.6 %
Neutrophils Abs Auto: 6.9 10*3/uL (ref 1.8–7.7)
Platelet Count: 168 10*3/uL (ref 130–400)
RDW: 14.5 % (ref 0.0–14.7)
Red Blood Cell Count: 4.57 10*6/uL (ref 4.50–5.90)
White Blood Cell Count: 7.4 10*3/uL (ref 4.5–11.0)

## 2020-03-17 LAB — UREA NITROGEN, BLOOD (BUN): Urea Nitrogen, Blood (BUN): 16 mg/dL (ref 8–22)

## 2020-03-17 LAB — ASPARTATE TRANSAMINASE (AST): Aspartate Transaminase (AST): 25 U/L (ref 15–43)

## 2020-03-17 LAB — ALANINE TRANSFERASE (ALT): Alanine Transferase (ALT): 33 U/L (ref 6–63)

## 2020-03-17 LAB — HEPATITIS B SURFACE ANTIGEN: Hepatitis B Surface Antigen: NONREACTIVE

## 2020-03-17 LAB — CREATININE/E-GFR
Creatinine Serum: 1.25 mg/dL (ref 0.44–1.27)
E-GFR Creatinine (Male): 68 mL/min/{1.73_m2}

## 2020-03-17 LAB — HEPATITIS C AB SCREEN: Hepatitis C Ab Screen: NONREACTIVE

## 2020-03-17 LAB — HEPATITIS B SURFACE ANTIBODY: HEPATITIS B SURFACE Ab: NONREACTIVE

## 2020-03-17 LAB — HEPATITIS B CORE AB, IGM: HEPATITIS B CORE Ab, IgM: NONREACTIVE

## 2020-03-17 MED ORDER — PREDNISONE 10 MG TABLET
10.0000 mg | ORAL_TABLET | Freq: Every day | ORAL | 0 refills | Status: DC
Start: 2020-03-17 — End: 2020-05-30

## 2020-03-17 MED ORDER — PREDNISOLONE ACETATE 1 % EYE DROPS,SUSPENSION
OPHTHALMIC | 5 refills | Status: AC
Start: 2020-03-17 — End: 2024-03-17

## 2020-03-17 MED ORDER — METHOTREXATE SODIUM 2.5 MG TABLET
ORAL_TABLET | ORAL | 0 refills | Status: AC
Start: 2020-03-17 — End: 2023-03-18

## 2020-03-17 MED ORDER — DORZOLAMIDE 2 % EYE DROPS
1.0000 [drp] | Freq: Three times a day (TID) | OPHTHALMIC | 3 refills | Status: AC
Start: 2020-03-17 — End: 2021-03-12

## 2020-03-17 MED ORDER — TIMOLOL MALEATE 0.5 % EYE DROPS
1.0000 [drp] | Freq: Every day | OPHTHALMIC | 3 refills | Status: AC
Start: 2020-03-17 — End: 2021-03-12

## 2020-03-17 MED ORDER — FOLIC ACID 1 MG TABLET
1.0000 mg | ORAL_TABLET | Freq: Every day | ORAL | 3 refills | Status: AC
Start: 2020-03-17 — End: 2021-03-12

## 2020-03-17 NOTE — Patient Instructions (Addendum)
Go to the lab on your way today to get your blood drawn.    Continue the [place] drop 3 times a day in the left eye  Continue the yellow drop once a day in both eyes    Decrease the prednisolone acetate drop to 4x a day in both eyes for 1 week then 3x a day for 1 week then 2x a day until you return    Decrease the oral prednisone to 3 pills once a day until you return    Start methotrexate pills, take 6 pills once a week.    Start folic acid, 1 pill once a day    Once you return from [place], get your bloodwork checked. Once you hear from [place] that your bloodwork is normal, increase the methotrexate to 8 pills once a week.    -----    Methotrexate - General Information    WHAT IS IT?    Methotrexate is one of the most effective and commonly used medications to treat uveitis. It is sometimes used in combination with other medications to treat uveitis.    HOW TO TAKE IT?    Methotrexate comes as either a pill or as a subcutaneous injection (under the skin, like insulin). It is taken as a single dose once per week (note this is once a week, not every day - this is very important). The dose can be split into two doses in the same day to minimize side effects. It is taken with a vitamin called folic acid to minimize side effects.    Methotrexate should not be taken if you have abnormal kidney or liver function. This is always checked before prescribing this medication. Alcohol significantly increases the risk of liver damage, so alcohol should be avoided, if possible, while on methotrexate. Labs will be checked on a regular basis to monitor your liver and kidneys. Please note that it is your responsibility to make sure you are going to the lab to have your labs checked on a regular basis.    It takes a while for methotrexate to work, sometimes up to 3-4 months, so patience is key on methotrexate and similar medications.    SIDE EFFECTS    Methotrexate lowers your immune system's ability to fight infections, so if you  have symptoms of an infection, please notify your rheumatologist or primary care doctor right away; they will likely hold the medication until you feel better.    Common side effects of methotrexate are gastrointestinal upset (indigestion, nausea, vomiting) and elevations of liver function tests, which is why it's important to check your labs regularly. About 1 to 3% of patients develop mouth sores (called stomatitis), rash, diarrhea, and abnormalities in blood counts. Some side effects do not have symptoms, which again emphasizes the importance of regular blood tests.    Methotrexate can cause cirrhosis (scarring) of the liver, but this is very rare and is most likely to occur in patients who already have liver problems or who drink alcohol while taking methotrexate. Lung problems (persistent cough or shortness of breath) can also happen while taking methotrexate. Some patients have hair loss; the hair grows back after methotrexate is stopped. This can often be managed by taking folic acid.    Methotrexate can cause fatigue, and if this happens it is usually worse on the day of the week that you take it, so please plan to take methotrexate on a day of the week when you don't need to have the most  energy.    While these seem like a lot of side effects, it is important to remember that most patients do not experience side effects, and for those who do have side effects, many of the minor side effects will improve with time.    Methotrexate is contraindicated in pregnancy or if you are planning to get pregnant. Men should also not take methotrexate if they plan to conceive. Even though methotrexate should not be taken during pregnancy, it does not reduce a woman's chance of becoming pregnant in the future.    WHEN TO CONTACT us OR PRIMARY CARE DOCTOR    Please contact us or your primary care doctor right away if you develop symptoms of an infection, such as a fever or cough, or if you are having shortness of  breath.    ----    INFORMATION ABOUT PREDNISONE    Prednisone is a steroid pill and it usually works very well to control inflammation in the eye. However, it can have certain side effects, especially when used for many months. These side effects are more likely to happen with high doses (40mg  a day or more) and less likely to happen with low doses (10mg  a day or less). Here is a list of possible side effects:    -hypertension (increase in blood pressure)  -hyperglycemia (increase in blood glucose. If you have diabetes, please make sure you let your primary care doctor or endocrinologist know right away that you are using oral prednisone, as you will likely need to monitor your blood glucose more closely, and you may need to adjust your diabetes medications)  -mood swings  -trouble sleeping  -headache  -weight gain  -fluid retention  -stomach ulcers (please take an antacid with oral prednisone to prevent ulcers from happening)  -damage to the hip joint. This is a very rare but also a potentially devastating complication. If you notice severe hip pain, please let me know right away. We will likely have to rapidly reduce your oral prednisone dose in as safe a manner as possible. If for some reason you are unable to get a hold of me or your primary care doctor, you need to come to the emergency department if you develop severe hip pain while on oral prednisone).   -osteoporosis (please take calcium and vitamin D with oral prednisone to prevent osteoporosis)    Keep in mind you are at increased risk of infection while on oral prednisone, so make sure you are washing your hands regularly and that you stay away from sick people. If you feel sick while on oral prednisone, please let me know right away.     When taken for more than 4 weeks, your body gets used to having prednisone and your adrenal glands, which are right above your kidneys, may stop making your body's own natural steroid. This is ok while you're on the  prednisone, but if you stop prednisone suddenly, this may shock your body because it will take the adrenal glands time to start making its own natural steroid again. You may experience symptoms of "adrenal insufficiency" if you stop prednisone suddenly after taking it for more than 4 weeks. Symptoms of adrenal insufficiency are fatigue, nausea, vomiting, abdominal pain, weakness, confusion, fever, and even coma. These symptoms can be severe. If you take prednisone for more than 4 more weeks, you must taper off the medication slowly. I will provide you with instructions for a slow taper when that time comes. For now, please  do not stop prednisone suddenly without discussing with me first. Generally, adrenal sufficiency does not happen if you take oral prednisone for less than 4 weeks.

## 2020-03-17 NOTE — Nursing Note (Signed)
Patient was wearing a surgical mask.  Droplet precautions were followed when caring for the patient.   PPE used by me during encounter: Surgical mask     Percell Lamboy, [facility]

## 2020-03-17 NOTE — Progress Notes (Signed)
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    Terry Stuart states he has been dealing with ocular problems off and on since around 20 years ago. He initially got a piece of glass in his eye in 1997 which required topical steroids. The steroids reportedly caused cataract and glaucoma for which he received extraction and an iStent. The history of the left eye is unclear, but he also developed inflammation at some point in this eye as well. Both eyes have undergone vitrectomy. He has required oral prednisone perhaps twice in the past.    He uses Pred Forte (prednisolone acetate 1%) and timolol off and on for flares. These usually manifest as redness, pain, foreign body sensation, and photophobia with later blurred vision.    He has been told many things in the past, including that he had Lyme disease or sarcoidosis, but neither of these diagnoses were ultimately confirmed.    Interval HPI 03/17/2020  States he is using PO prednisone 40 mg since last Tuesday as directed.  Reports has gained 15 pounds and is having mood swings.  Has not made or heard from rhuematology.  Has ran out of timolol OU and Dorz Tid OS for last 3 weeks.  States will be out of town in [place] from 03/21/20 - 04/07/20.  Denies vision changes OU.    Uveitis ROS 02/18/2020:   Gen: No acute distress   Oral ulcers: No   Genital ulcers: No   Cold sores: No   Ringing in ears or hearing loss: No   Weight loss or gain: No   Numbness, tingling, weakness in extremities: No   Loss of bowel or bladder function: No   Diarrhea or blood in stool: Yes - blood in the stool, was told hemorrhoids   Joint pains: Yes - gout in the toes, attributes these to playing many sports with repetitive minor injuries (knee, ankle, shoulder)   Skin rashes: No   Cats or dogs at home: Yes - dogs   Recent foreign travel: No   Sexually transmitted infection: No    Past Medical History:  Past  Medical History:   Diagnosis Date    Iritis of left eye     chronic, recurrent       Medications:  Current Outpatient Medications   Medication Sig Dispense Refill    dorzolamide HCl (DORZOLAMIDE OPHTH) Instill into the affected eye(s). One drop TID OS      Prednisolone Acetate (PRED FORTE, ECONOPRED PLUS) 1% Ophthalmic Suspension Instill 1 drop into EACH eye six times daily. 10 mL 5    PredniSONE (DELTASONE) 10 mg Tablet Take 1 tablet by mouth every morning with a meal. Take 60 mg (6 pills) daily for 1 week, then 50 mg (5 pills) daily for 1 week, then 40 mg (4 pills) daily until follow-up 200 tablet 0    Timolol (TIMOPTIC) 0.5% Ophthalmic Solution Instill 1 drop into EACH eye 2 times daily. 5 mL 0     No current facility-administered medications for this visit.       Ocular meds (drops and systemic):  Pred Forte (prednisolone acetate 1%) 6XD OU  Dorzolamide TID OS  Timolol daily OU    Allergies:  Allergies   Allergen Reactions    Seafood Hives        Family history of uveitis: No    Social history:  Social History  Socioeconomic History    Marital status: SINGLE     Spouse name: Not on file    Number of children: Not on file    Years of education: Not on file    Highest education level: Not on file   Occupational History    Not on file   Tobacco Use    Smoking status: Not on file    Smokeless tobacco: Not on file   Substance and Sexual Activity    Alcohol use: Not on file    Drug use: Not on file    Sexual activity: Not on file       Syphilis/TB labs:  12/24/19 negative Quant gold, syphilis    Pre-IMT labs:  Lab Results   Component Value Date    WBC 6.9 12/24/2019    RBC 4.70 12/24/2019    HGB 14.1 12/24/2019    HCT 42.4 12/24/2019    MCV 90.3 12/24/2019    MCH 30.1 12/24/2019    MCHC 33.3 12/24/2019    RDW 13.4 12/24/2019    MPV 8.9 12/24/2019    PLT 229 12/24/2019       Hepatitis C Ab Screen   Date Value Ref Range Status   12/24/2019 Nonreactive Nonreactive Final     Aspartate Transaminase (AST)    Date Value Ref Range Status   12/24/2019 24 15 - 43 U/L Final     Alanine Transferase (ALT)   Date Value Ref Range Status   12/24/2019 15 6 - 63 U/L Final     Urea Nitrogen, Blood (BUN)   Date Value Ref Range Status   12/24/2019 13 8 - 22 mg/dL Final     Creatinine Serum   Date Value Ref Range Status   12/24/2019 1.01 0.44 - 1.27 mg/dL Final       IMT monitoring labs:  Lab Results   Component Value Date    WBC 6.9 12/24/2019    RBC 4.70 12/24/2019    HGB 14.1 12/24/2019    HCT 42.4 12/24/2019    MCV 90.3 12/24/2019    MCH 30.1 12/24/2019    MCHC 33.3 12/24/2019    RDW 13.4 12/24/2019    MPV 8.9 12/24/2019    PLT 229 12/24/2019       Aspartate Transaminase (AST)   Date Value Ref Range Status   12/24/2019 24 15 - 43 U/L Final     Alanine Transferase (ALT)   Date Value Ref Range Status   12/24/2019 15 6 - 63 U/L Final     Urea Nitrogen, Blood (BUN)   Date Value Ref Range Status   12/24/2019 13 8 - 22 mg/dL Final     Creatinine Serum   Date Value Ref Range Status   12/24/2019 1.01 0.44 - 1.27 mg/dL Final       Eye Exam:  Base Eye Exam     Visual Acuity (Snellen - Linear)       Right Left    Dist sc 20/60 20/80    Dist ph sc 20/40 20/60          Tonometry ([facility], 1:14 PM)       Right Left    Pressure 15 16          Pupils       Dark Shape React    Right 3  +    Left 3 Irregular           Extraocular Movement       Right  Left     Full Full          Neuro/Psych     Oriented x3: Yes    Mood/Affect: Normal          Dilation     Both eyes: 1.0% Mydriacyl, 2.5% Phenylephrine @ 1:14 PM   Sunglasses were offered to patient today but declined.  Patient states brought sunglasses.  Amanda Cockayne HLT I                 Data Reviewed:    OCT macula 02/18/2020:  OD: ERM, CME, mild  OS: ERM, CME, center-involving, worse than OD    Optos FA 02/18/20:  OU: Diffuse retinal leakage, disc leakage, macular leakage, and vasculitis    OCT macula 03/17/2020:  OD: ERM, very tr CME  OS: ERM, very trace CME (improved)    Optos FA  03/17/2020:  OD: improved nerve leakage and mildly improved peripheral leakage  OS: improved nerve leakage and around arcades; persistent peripheral leakage        Workup labs 12/24/19:  Negative: HIV, HCV, ACE, Quant Gold, Syphilis IgG/IgM, urine B2 microglobulin  Positive: HLA-B27    Chest XR: 12/24/19  IMPRESSION:  1. No acute cardiopulmonary disease    ASSESSMENT:  1. 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 Pseudophakia OU    3. 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 Discs with cupping. IOP seems well controlled on current regimen.    PLAN:  - Taper 40 mg (4 pills) to 35 mg x 1 week, then 30 mg until next visit  - may also consider Ozurdex OU in future if ok with glaucoma service  - Send message to Rheumatology (referral made last visit) to get patient urgent appt before 03/21/20 or after 04/07/20 to consider initiation of steroid-sparing immunomodulatory therapy given diagnosis of recurrent noninfectious uveitis. I would suggest methotrexate or mycophenolate as first options.  - taper Pred Forte (prednisolone acetate 1%) 4 times daily OU x 1 week, then TID until follow up  - Continue Dorzolamide three times daily OS (refilled today)  - Continue Timolol daily OU (refilled today)  - Refer to glaucoma service to establish care    - Follow-up in 4 weeks with OCT OU, Optos FA OU. He is on parole and is allowed to travel for the holidays. Will be in [place] from 03/21/20 - 04/07/20.     Vaughan Sine MD  Retina Fellow  Pager [phone]

## 2020-03-17 NOTE — Progress Notes (Signed)
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    Terry Stuart states he has been dealing with ocular problems off and on since around 20 years ago. He initially got a piece of glass in his eye in 1997 which required topical steroids. The steroids reportedly caused cataract and glaucoma for which he received extraction and an iStent. The history of the left eye is unclear, but he also developed inflammation at some point in this eye as well. Both eyes have undergone vitrectomy. He has required oral prednisone perhaps twice in the past.    He uses Pred Forte (prednisolone acetate 1%) and timolol off and on for flares. These usually manifest as redness, pain, foreign body sensation, and photophobia with later blurred vision.    He has been told many things in the past, including that he had Lyme disease or sarcoidosis, but neither of these diagnoses were ultimately confirmed.    Interval HPI 03/17/2020:  States he is using PO prednisone 40 mg since last Tuesday as directed. Reports has gained 15 pounds and is having mood swings. Has not made or heard from rhuematology. Has ran out of timolol OU and Dorz Tid OS for last 3 weeks. States will be out of town in [place] from 03/21/20 - 04/07/20.  Denies vision changes OU.    Uveitis ROS 02/18/2020:   Gen: No acute distress   Oral ulcers: No   Genital ulcers: No   Cold sores: No   Ringing in ears or hearing loss: No   Weight loss or gain: No   Numbness, tingling, weakness in extremities: No   Loss of bowel or bladder function: No   Diarrhea or blood in stool: Yes - blood in the stool, was told hemorrhoids   Joint pains: Yes - gout in the toes, attributes these to playing many sports with repetitive minor injuries (knee, ankle, shoulder)   Skin rashes: No   Cats or dogs at home: Yes - dogs   Recent foreign travel: No   Sexually transmitted infection: No    Past Medical History  03/17/2020:  Denies any past medical history aside from uveitis, and denies taking any other medications other than the ones we prescribe.    Past Medical History:   Diagnosis Date    Iritis of left eye     chronic, recurrent       Medications:  Current Outpatient Medications   Medication Sig Dispense Refill    Dorzolamide (TRUSOPT) 2 % Ophthalmic Solution Instill 1 drop into the LEFT eye 3 times daily. 5 mL 3    dorzolamide HCl (DORZOLAMIDE OPHTH) Instill into the affected eye(s). One drop TID OS      Folic Acid 1 mg Tablet Take 1 tablet by mouth every day. 90 tablet 3    Methotrexate (RHEUMATREX) 2.5 mg Tablet Take 6 pills once a week for 2 weeks, then increase to 8 pills once a week after you have your labs checked. 120 tablet 0    Prednisolone Acetate (PRED FORTE, ECONOPRED PLUS) 1% Ophthalmic Suspension Put 1 drop in both eyes four times a day. 10 mL 5    PredniSONE (DELTASONE) 10 mg Tablet Take 1 tablet by mouth every morning with a meal. Take 3 pills once a day until follow up with Terry Stuart 200 tablet 0    Timolol (TIMOPTIC) 0.5% Ophthalmic Solution Instill 1 drop into Terry Stuart eye 2  times daily. 5 mL 0    Timolol (TIMOPTIC) 0.5% Ophthalmic Solution Instill 1 drop into EACH eye every day. 5 mL 3     No current facility-administered medications for this visit.       Ocular meds (drops and systemic):  Oral prednisone 60mg  daily  Pred Forte (prednisolone acetate 1%) 6XD OU  Dorzolamide TID OS  Timolol daily OU    Allergies:  Allergies   Allergen Reactions    Seafood Hives        Family history of uveitis: No    Social history:  Social History     Socioeconomic History    Marital status: SINGLE     Spouse name: Not on file    Number of children: Not on file    Years of education: Not on file    Highest education level: Not on file   Occupational History    Not on file   Tobacco Use    Smoking status: Not on file    Smokeless tobacco: Not on file   Substance and Sexual Activity    Alcohol use: Not  on file    Drug use: Not on file    Sexual activity: Not on file       Syphilis/TB labs:  12/24/19 negative Quant gold, syphilis    Pre-IMT labs:  Lab Results   Component Value Date    WBC 6.9 12/24/2019    RBC 4.70 12/24/2019    HGB 14.1 12/24/2019    HCT 42.4 12/24/2019    MCV 90.3 12/24/2019    MCH 30.1 12/24/2019    MCHC 33.3 12/24/2019    RDW 13.4 12/24/2019    MPV 8.9 12/24/2019    PLT 229 12/24/2019       Hepatitis C Ab Screen   Date Value Ref Range Status   12/24/2019 Nonreactive Nonreactive Final     Aspartate Transaminase (AST)   Date Value Ref Range Status   12/24/2019 24 15 - 43 U/L Final     Alanine Transferase (ALT)   Date Value Ref Range Status   12/24/2019 15 6 - 63 U/L Final     Urea Nitrogen, Blood (BUN)   Date Value Ref Range Status   12/24/2019 13 8 - 22 mg/dL Final     Creatinine Serum   Date Value Ref Range Status   12/24/2019 1.01 0.44 - 1.27 mg/dL Final       IMT monitoring labs:  Lab Results   Component Value Date    WBC 6.9 12/24/2019    RBC 4.70 12/24/2019    HGB 14.1 12/24/2019    HCT 42.4 12/24/2019    MCV 90.3 12/24/2019    MCH 30.1 12/24/2019    MCHC 33.3 12/24/2019    RDW 13.4 12/24/2019    MPV 8.9 12/24/2019    PLT 229 12/24/2019       Aspartate Transaminase (AST)   Date Value Ref Range Status   12/24/2019 24 15 - 43 U/L Final     Alanine Transferase (ALT)   Date Value Ref Range Status   12/24/2019 15 6 - 63 U/L Final     Urea Nitrogen, Blood (BUN)   Date Value Ref Range Status   12/24/2019 13 8 - 22 mg/dL Final     Creatinine Serum   Date Value Ref Range Status   12/24/2019 1.01 0.44 - 1.27 mg/dL Final       Eye Exam:  Base Eye Exam  Visual Acuity (Snellen - Linear)       Right Left    Dist sc 20/60 20/80    Dist ph sc 20/40 20/60          Tonometry ([facility], 1:14 PM)       Right Left    Pressure 15 16          Pupils       Dark Shape React    Right 3  +    Left 3 Irregular           Extraocular Movement       Right Left     Full Full          Neuro/Psych     Oriented x3: Yes     Mood/Affect: Normal          Dilation     Both eyes: 1.0% Mydriacyl, 2.5% Phenylephrine @ 1:14 PM   Sunglasses were offered to patient today but declined.  Patient states brought sunglasses.  Amanda Cockayne HLT I               Slit Lamp and Fundus Exam     External Exam       Right Left    External Normal Normal          Slit Lamp Exam       Right Left    Lids/Lashes Normal Normal    Conjunctiva/Sclera White and Quiet White and Quiet    Cornea Clear corneal incision, no KP Clear corneal incision, faint fine KP    Anterior Chamber tr+ cell, fluorescin flare tr+ cell, fluorescin flare    Iris Few TIDs, pharm dilated, no nodules Few TIDs, pharm dilated, no nodules    Lens PCIOL, PC intact  PCIOL, open PC    Vitreous s/p vitrectomy, tr+ cell s/p vitrectomy, tr+ cell          Fundus Exam       Right Left    Disc cupping cupping    C/D Ratio 0.8 0.8    Macula attached attached    Vessels perivascular sclerosis and exudation perivascular sclerosis and exudation    Periphery attached attached              Data Reviewed:    OCT macula 02/18/2020:  OD: ERM, CME, mild  OS: ERM, CME, center-involving, worse than OD    Optos FA 02/18/20:  OU: Diffuse retinal leakage, disc leakage, macular leakage, and vasculitis    OCT macula 03/17/2020:  OD: ERM, CME resolved  OS: ERM, CME almost resolved    Optos FA 03/17/2020:  OD: improved nerve leakage and mildly improved peripheral leakage  OS: improved nerve leakage and around arcades; persistent peripheral leakage    Workup labs 12/24/19:  Negative: HIV, HCV, ACE, Quant Gold, Syphilis IgG/IgM, urine B2 microglobulin  Positive: HLA-B27    Chest XR: 12/24/19  IMPRESSION:  1. No acute cardiopulmonary disease    ASSESSMENT:    1. 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    2.  Pseudophakia OU    3. 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 Discs with cupping. IOP seems well controlled on current regimen. Probably had some sort of MIGS (possible istent OU?). Does not have glaucoma tubes.    PLAN:  - Decrease oral prednisone (10 mg pills) to 3 pills once a day until next visit   - Decrease the prednisolone acetate drop to 4x a day in both eyes for 1 week, then 3x a day for 1 week, then 2x a day until next visit   - Start Methotrexate (2.5mg ); take 6 pills once a week.  - Start Folic acid, 1 pill once a day  - Blood work ordered today. Once results are in showing bloodwork is normal, will increase Methotrexate to 8 pills once a week.  - will also start adalimumab for noninfectious panuveitis.  - May consider Ozurdex OU in future if okay with glaucoma service and if needed but will try to manage with IMT as his discs have cupping  - Continue Dorzolamide three times daily left eye (refilled today)  - Continue Timolol daily both eyes (refilled today)  - Refer to glaucoma service to establish care    - Follow-up in 4 weeks with OCT OU, Optos OU (no need for FA). He is on parole and is allowed to travel for the holidays. Will be in [place] from 03/21/20 - 04/07/20.     I confirm that I reviewed the patient's medical history and other pertinent data, and personally examined the patient with the ophthalmic exam findings documented here.     I agree with the exam findings, treatment plan, and medical decision making reported by the resident or fellow, with any exceptions as noted above.    SCRIBE DISCLAIMER:   This note was scribed by Burt Knack, SCRIBE, a trained medical scribe in the  presence of Dr. Donnita Falls, M.D.  Electronically signed by Burt Knack, SCRIBE, 03/17/2020 2:46 PM    PROVIDER DISCLAIMER:   This document serves as my personal record of services taken in my presence. It was created on 03/17/2020 on my behalf by the trained medical scribe listed above.     I have reviewed this document and agree that this note accurately reflects the history and exam findings, the patient care provided, and my medical decision making.     Donnita Falls, MD

## 2020-03-17 NOTE — Progress Notes (Signed)
Fluorescein Angiography Nursing Note:  Procedure consent obtained and patient review of  medication allergies,  potential side effect of nausea, dizziness,  itching, pain, urine and skin discoloration and allergic reaction performed by Ardine Eng, RN prior to procedure.   Pre angio VS   BP: 138/88, P:14, R:14 unlabored, SpO2 99% RA.  500mg /76ml of  Fluorescein given followed by 10-8 mls normal saline IV via 25 gauge butterfly needle in left AC vein with free-flowing positive blood return.   Patient tolerated procedure very . Patient denied any nausea, dizziness, lightheadedness, itching or pain.

## 2020-03-18 MED ORDER — FLUORESCEIN 500 MG/5 ML (10 %) INTRAVENOUS SOLUTION
5.0000 mL | INTRAVENOUS | Status: AC | PRN
Start: 2020-03-18 — End: 2020-03-18
  Administered 2020-03-18: 13:00:00 5 mL via INTRAMUSCULAR

## 2020-03-19 NOTE — Addendum Note (Signed)
Addended by: Alphonzo Cruise on: 03/19/2020 09:13 AM     Modules accepted: Orders

## 2020-03-21 ENCOUNTER — Other Ambulatory Visit: Payer: Self-pay

## 2020-03-21 MED ORDER — ADALIMUMAB 40 MG/0.4 ML SUBCUTANEOUS PEN KIT
40.0000 mg | PEN_INJECTOR | SUBCUTANEOUS | 11 refills | Status: AC
Start: 2020-03-21 — End: 2021-03-16
  Filled 2020-03-21: qty 2, 28d supply, fill #0

## 2020-03-21 MED ORDER — ADALIMUMAB 80 MG/0.8 ML (X 1) AND 40 MG/0.4 ML (X 2) SUBCUTAN PEN KIT
1.0000 | PEN_INJECTOR | SUBCUTANEOUS | 0 refills | Status: AC
Start: 2020-03-21 — End: 2020-04-18
  Filled 2020-03-21 – 2020-04-08 (×2): qty 3, 28d supply, fill #0

## 2020-03-21 NOTE — Addendum Note (Signed)
Addended by: Alphonzo Cruise on: 03/21/2020 11:54 AM     Modules accepted: Orders

## 2020-03-23 ENCOUNTER — Other Ambulatory Visit: Payer: Self-pay

## 2020-03-24 ENCOUNTER — Telehealth: Payer: Self-pay

## 2020-03-24 ENCOUNTER — Other Ambulatory Visit: Payer: Self-pay

## 2020-03-24 NOTE — Telephone Encounter (Signed)
Hi Dr. Murvin Natal,    Patient's Humira CF starter kit has been approved by [employer] and will let the patient know.  We will also start on the TAR for the lower 40 mg CF maintenance dose.     Thank you,  Ival Bible, PharmD  [facility]

## 2020-03-25 ENCOUNTER — Other Ambulatory Visit: Payer: Self-pay

## 2020-03-31 ENCOUNTER — Encounter: Payer: Self-pay | Admitting: OPHTHALMOLOGY

## 2020-04-02 ENCOUNTER — Other Ambulatory Visit: Payer: Self-pay

## 2020-04-07 ENCOUNTER — Ambulatory Visit: Payer: MEDICAID

## 2020-04-07 ENCOUNTER — Ambulatory Visit: Payer: MEDICAID | Attending: Retina Specialist | Admitting: OPHTHALMOLOGY

## 2020-04-07 DIAGNOSIS — H44113 Panuveitis, bilateral: Secondary | ICD-10-CM | POA: Insufficient documentation

## 2020-04-07 DIAGNOSIS — H40003 Preglaucoma, unspecified, bilateral: Secondary | ICD-10-CM | POA: Insufficient documentation

## 2020-04-07 LAB — CBC WITH DIFFERENTIAL
Basophils % Auto: 0.3 %
Basophils Abs Auto: 0 10*3/uL (ref 0.0–0.2)
Eosinophils % Auto: 0.3 %
Eosinophils Abs Auto: 0 10*3/uL (ref 0.0–0.5)
Hematocrit: 44.7 % (ref 40.0–52.0)
Hemoglobin: 14.6 g/dL (ref 14.0–18.0)
Lymphocytes % Auto: 6.6 %
Lymphocytes Abs Auto: 0.9 10*3/uL — ABNORMAL LOW (ref 1.0–4.8)
MCH: 30.8 pg (ref 27.0–33.0)
MCHC: 32.8 % (ref 32.0–36.0)
MCV: 93.9 fL (ref 80.0–100.0)
MPV: 9 fL (ref 6.8–10.0)
Monocytes % Auto: 7.2 %
Monocytes Abs Auto: 0.9 10*3/uL — ABNORMAL HIGH (ref 0.1–0.8)
Neutrophils % Auto: 85.6 %
Neutrophils Abs Auto: 11.1 10*3/uL — ABNORMAL HIGH (ref 1.8–7.7)
Platelet Count: 223 10*3/uL (ref 130–400)
RDW: 14.4 % (ref 0.0–14.7)
Red Blood Cell Count: 4.76 10*6/uL (ref 4.50–5.90)
White Blood Cell Count: 13 10*3/uL — ABNORMAL HIGH (ref 4.5–11.0)

## 2020-04-07 LAB — UREA NITROGEN, BLOOD (BUN): Urea Nitrogen, Blood (BUN): 12 mg/dL (ref 8–22)

## 2020-04-07 LAB — CREATININE/E-GFR
Creatinine Serum: 1.08 mg/dL (ref 0.44–1.27)
E-GFR Creatinine (Male): 81 mL/min/{1.73_m2}

## 2020-04-07 LAB — ASPARTATE TRANSAMINASE (AST): Aspartate Transaminase (AST): 18 U/L (ref 15–43)

## 2020-04-07 LAB — ALANINE TRANSFERASE (ALT): Alanine Transferase (ALT): 18 U/L (ref 6–63)

## 2020-04-07 NOTE — Progress Notes (Signed)
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    Terry Stuart states he has been dealing with ocular problems off and on since around 20 years ago. He initially got a piece of glass in his eye in 1997 which required topical steroids. The steroids reportedly caused cataract and glaucoma for which he received extraction and an iStent. The history of the left eye is unclear, but he also developed inflammation at some point in this eye as well. Both eyes have undergone vitrectomy. He has required oral prednisone perhaps twice in the past.    He uses Pred Forte (prednisolone acetate 1%) and timolol off and on for flares. These usually manifest as redness, pain, foreign body sensation, and photophobia with later blurred vision.    He has been told many things in the past, including that he had Lyme disease or sarcoidosis, but neither of these diagnoses were ultimately confirmed.    Interval HPI 03/17/2020:  States he is using PO prednisone 40 mg since last Tuesday as directed. Reports has gained 15 pounds and is having mood swings. Has not made or heard from rhuematology. Has ran out of timolol OU and Dorz Tid OS for last 3 weeks. States will be out of town in [place] from 03/21/20 - 04/07/20.  Denies vision changes OU.    Interval HPI 04/07/2020:  States has not heard back from radiology regarding MRI Brain.  States he is using PO prednisone 30 mg as directed; though only using 6 pills MTX; using PF BID.  States experienced intermittent impotence and feels it may be due to the prednisone.  Denies vision changes OU.    Uveitis ROS 02/18/2020:   Gen: No acute distress   Oral ulcers: No   Genital ulcers: No   Cold sores: No   Ringing in ears or hearing loss: No   Weight loss or gain: No   Numbness, tingling, weakness in extremities: No   Loss of bowel or bladder function: No   Diarrhea or blood in stool: Yes - blood in the stool, was told  hemorrhoids   Joint pains: Yes - gout in the toes, attributes these to playing many sports with repetitive minor injuries (knee, ankle, shoulder)   Skin rashes: No   Cats or dogs at home: Yes - dogs   Recent foreign travel: No   Sexually transmitted infection: No    Past Medical History 03/17/2020:  Denies any past medical history aside from uveitis, and denies taking any other medications other than the ones we prescribe.    Past Medical History:   Diagnosis Date    Iritis of left eye     chronic, recurrent       Medications:  Current Outpatient Medications   Medication Sig Dispense Refill    Adalimumab (HUMIRA) 40 mg/0.4 mL Pen Injector Kit Inject 0.4 mL (1 syringe) subcutaneously every other week. Perform subcutaneous injection every other week 2 each 11    adalimumab 80 mg/0.8 mL-40 mg/0.4 mL Pen Injector Kit As Directed. Inject 80mg  subcutaneous injection on day 1  then 40 mg subcutaneous injection on day 8 and then 40 mg every other week thereafter 3 each 0    Dorzolamide (TRUSOPT) 2 % Ophthalmic Solution Instill 1 drop into the LEFT eye 3 times daily. 5 mL 3    dorzolamide HCl (DORZOLAMIDE OPHTH) Instill into the affected eye(s). One drop TID OS  Folic Acid 1 mg Tablet Take 1 tablet by mouth every day. 90 tablet 3    Methotrexate (RHEUMATREX) 2.5 mg Tablet Take 6 pills once a week for 2 weeks, then increase to 8 pills once a week after you have your labs checked. 120 tablet 0    Prednisolone Acetate (PRED FORTE, ECONOPRED PLUS) 1% Ophthalmic Suspension Put 1 drop in both eyes four times a day. 10 mL 5    PredniSONE (DELTASONE) 10 mg Tablet Take 1 tablet by mouth every morning with a meal. Take 3 pills once a day until follow up with Dr. Quintella Stuart 200 tablet 0    Timolol (TIMOPTIC) 0.5% Ophthalmic Solution Instill 1 drop into EACH eye 2 times daily. 5 mL 0    Timolol (TIMOPTIC) 0.5% Ophthalmic Solution Instill 1 drop into EACH eye every day. 5 mL 3     No current facility-administered  medications for this visit.       Ocular meds (drops and systemic):  Oral prednisone 60mg  daily  Pred Forte (prednisolone acetate 1%) 6XD OU  Dorzolamide TID OS  Timolol daily OU    Allergies:  Allergies   Allergen Reactions    Fish Hives     from [place]        Family history of uveitis: No    Social history:  Social History     Socioeconomic History    Marital status: SINGLE     Spouse name: Not on file    Number of children: Not on file    Years of education: Not on file    Highest education level: Not on file   Occupational History    Not on file   Tobacco Use    Smoking status: Not on file    Smokeless tobacco: Not on file   Substance and Sexual Activity    Alcohol use: Not on file    Drug use: Not on file    Sexual activity: Not on file       Syphilis/TB labs:  12/24/19 negative Quant gold, syphilis    Pre-IMT labs:  Lab Results   Component Value Date    WBC 7.4 03/17/2020    RBC 4.57 03/17/2020    HGB 14.3 03/17/2020    HCT 43.4 03/17/2020    MCV 95.0 03/17/2020    MCH 31.2 03/17/2020    MCHC 32.9 03/17/2020    RDW 14.5 03/17/2020    MPV 8.8 03/17/2020    PLT 168 03/17/2020       Hepatitis B Surface Antigen   Date Value Ref Range Status   03/17/2020 Nonreactive Nonreactive Final     HEPATITIS B CORE Ab, IgM   Date Value Ref Range Status   03/17/2020 Nonreactive Nonreactive Final     Hepatitis C Ab Screen   Date Value Ref Range Status   03/17/2020 Nonreactive Nonreactive Final     Aspartate Transaminase (AST)   Date Value Ref Range Status   03/17/2020 25 15 - 43 U/L Final     Alanine Transferase (ALT)   Date Value Ref Range Status   03/17/2020 33 6 - 63 U/L Final     Comment:     Reference intervals are not available for this population.  Male Reference Range5-54U/L  Male Reference Range6-63U/L         Urea Nitrogen, Blood (BUN)   Date Value Ref Range Status   03/17/2020 16 8 - 22 mg/dL Final  Creatinine Serum   Date Value Ref Range Status   03/17/2020 1.25 0.44 - 1.27  mg/dL Final       IMT monitoring labs:  Lab Results   Component Value Date    WBC 7.4 03/17/2020    RBC 4.57 03/17/2020    HGB 14.3 03/17/2020    HCT 43.4 03/17/2020    MCV 95.0 03/17/2020    MCH 31.2 03/17/2020    MCHC 32.9 03/17/2020    RDW 14.5 03/17/2020    MPV 8.8 03/17/2020    PLT 168 03/17/2020       Aspartate Transaminase (AST)   Date Value Ref Range Status   03/17/2020 25 15 - 43 U/L Final   12/24/2019 24 15 - 43 U/L Final     Alanine Transferase (ALT)   Date Value Ref Range Status   03/17/2020 33 6 - 63 U/L Final     Comment:     Reference intervals are not available for this population.  Male Reference Range5-54U/L  Male Reference Range6-63U/L       12/24/2019 15 6 - 63 U/L Final     Urea Nitrogen, Blood (BUN)   Date Value Ref Range Status   03/17/2020 16 8 - 22 mg/dL Final   12/24/2019 13 8 - 22 mg/dL Final     Creatinine Serum   Date Value Ref Range Status   03/17/2020 1.25 0.44 - 1.27 mg/dL Final   12/24/2019 1.01 0.44 - 1.27 mg/dL Final       Eye Exam:  Not recorded       Data Reviewed:    OCT macula 02/18/2020:  OD: ERM, CME, mild  OS: ERM, CME, center-involving, worse than OD    Optos FA 02/18/20:  OU: Diffuse retinal leakage, disc leakage, macular leakage, and vasculitis    OCT macula 03/17/2020:  OD: ERM, CME resolved  OS: ERM, CME almost resolved     Optos FA 03/17/2020:  OD: improved nerve leakage and mildly improved peripheral leakage  OS: improved nerve leakage and around arcades; persistent peripheral leakage    OCT macula 03/17/2020:  OD: ERM, very tr CME  OS: ERM,  very tr CME    Optos 04/07/2020  OU: attached, attenuated vessels (Stable)    Workup labs 12/24/19:  Negative: HIV, HCV, ACE, Quant Gold, Syphilis IgG/IgM, urine B2 microglobulin  Positive: HLA-B27    Chest XR: 12/24/19  IMPRESSION:  1. No acute cardiopulmonary disease    ASSESSMENT:    1. 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    2. Pseudophakia OU    3. 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 Discs with cupping. IOP seems well controlled on current regimen. Probably had some sort of MIGS (possible istent OU?). Does not have glaucoma tubes.    4. Operculated holes OD  - 2 inferior noted today, 04/07/2020    PLAN:  - Decrease oral prednisone (10 mg pills) to 2.5 pills for 2 weeks, then 2 pills until next visit   -  taper prednisolone acetate drop to daily OU   - Continue Folic acid, 1 pill once a day  - Increase Methotrexate to 8 pills once a week today  - repeat labs in 3 weeks, if wnl go up to 10 pills a week  - follow up for MRI Brain - auth still pending [employer]) - will send Ronalee Red message to follow up with radiology  - will also start adalimumab (approved) for noninfectious panuveitis once MRI Brain completed  - May consider Ozurdex OU in future if okay with glaucoma service and if needed but will try to manage with IMT as his discs have cupping  - Continue Dorzolamide three times daily left eye  - Continue Timolol daily both eyes   - Referred to glaucoma service to establish care    - Follow-up in 4 weeks with OCT OU, Optos OU       Vaughan Sine MD  Retina Fellow  Pager [phone]

## 2020-04-07 NOTE — Patient Instructions (Addendum)
Please stop by the lab on the way out to get your blood work done. If lab work is normal, we will call you to increase your Methotrexate to 10 pills weekly. For now:    - Decrease oral prednisone (10 mg pills) to 2 pills a day until next visit   - Continue prednisolone acetate twice daily in both eyes  - Continue Folic acid, 1 pill once a day  - Please get your MRI Brain done once insurance approves the scan  - Continue Dorzolamide three times daily left eye  - Continue Timolol daily both eyes "    Follow-up in 1 month    ---------------------------------------------------------------------------------------------------------------------------------    Radiology scheduling   [phone]    Monday - Friday  7:00 a.m. - 7:00 p.m.  Saturday and Sunday  7:00 a.m. - 5:00 p.m.    Press * - For appointments in [facility] facility  Press 1 - For general appointments and screening mammograms (this is the option you want to select)  Press 3 - For Interventional Radiology Procedures  Press 4 - For directions to our facilities  If you are not sure what selection to make, please stay on the line.

## 2020-04-07 NOTE — Progress Notes (Addendum)
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    Mr. WNeumanstates he has been dealing with ocular problems off and on since around 20 years ago. He initially got a piece of glass in his eye in 1997 which required topical steroids. The steroids reportedly caused cataract and glaucoma for which he received extraction and an iStent. The history of the left eye is unclear, but he also developed inflammation at some point in this eye as well. Both eyes have undergone vitrectomy. He has required oral prednisone perhaps twice in the past.    He uses Pred Forte (prednisolone acetate 1%) and timolol off and on for flares. These usually manifest as redness, pain, foreign body sensation, and photophobia with later blurred vision.    He has been told many things in the past, including that he had Lyme disease or sarcoidosis, but neither of these diagnoses were ultimately confirmed.    Interval HPI 03/17/2020:  States he is using PO prednisone 40 mg since last Tuesday as directed. Reports has gained 15 pounds and is having mood swings. Has not made or heard from rhuematology. Has ran out of timolol OU and Dorz Tid OS for last 3 weeks. [place]  Denies vision changes OU.    Interval history 04/07/2020:  States has not heard back from radiology regarding MRI Brain. States he is using PO prednisone 30 mg as directed; though only using 6 pills MTX; using PF BID. States experienced intermittent impotence and feels it may be due to the prednisone. Denies vision changes OU.    Uveitis ROS 02/18/2020:   Gen: No acute distress   Oral ulcers: No   Genital ulcers: No   Cold sores: No   Ringing in ears or hearing loss: No   Weight loss or gain: No   Numbness, tingling, weakness in extremities: No   Loss of bowel or bladder function: No   Diarrhea or blood in stool: Yes - blood in the stool, was told  hemorrhoids   Joint pains: Yes - gout in the toes, attributes these to playing many sports with repetitive minor injuries (knee, ankle, shoulder)   Skin rashes: No   Cats or dogs at home: Yes - dogs   Recent foreign travel: No   Sexually transmitted infection: No    Past Medical History 03/17/2020:  Denies any past medical history aside from uveitis, and denies taking any other medications other than the ones we prescribe.    Past Medical History:   Diagnosis Date    Iritis of left eye     chronic, recurrent       Medications:  Current Outpatient Medications   Medication Sig Dispense Refill    Adalimumab (HUMIRA) 40 mg/0.4 mL Pen Injector Kit Inject 0.4 mL (1 syringe) subcutaneously every other week. Perform subcutaneous injection every other week 2 each 11    adalimumab 80 mg/0.8 mL-40 mg/0.4 mL Pen Injector Kit As Directed. Inject 829msubcutaneous injection on day 1  then 40 mg subcutaneous injection on day 8 and then 40 mg every other week thereafter 3 each 0    Dorzolamide (TRUSOPT) 2 % Ophthalmic Solution Instill 1 drop into the LEFT eye 3 times daily. 5 mL 3    dorzolamide HCl (DORZOLAMIDE OPHTH) Instill into the affected eye(s). One drop TID OS  Folic Acid 1 mg Tablet Take 1 tablet by mouth every day. 90 tablet 3    Methotrexate (RHEUMATREX) 2.5 mg Tablet Take 6 pills once a week for 2 weeks, then increase to 8 pills once a week after you have your labs checked. 120 tablet 0    Prednisolone Acetate (PRED FORTE, ECONOPRED PLUS) 1% Ophthalmic Suspension Put 1 drop in both eyes four times a day. 10 mL 5    PredniSONE (DELTASONE) 10 mg Tablet Take 1 tablet by mouth every morning with a meal. Take 3 pills once a day until follow up with Dr. Quintella Baton 200 tablet 0    Timolol (TIMOPTIC) 0.5% Ophthalmic Solution Instill 1 drop into EACH eye 2 times daily. 5 mL 0    Timolol (TIMOPTIC) 0.5% Ophthalmic Solution Instill 1 drop into EACH eye every day. 5 mL 3     No current facility-administered  medications for this visit.       Ocular meds (drops and systemic) 04/07/20:  Oral prednisone 9m daily  Methotrexate 128UXPO weekly  Folic acid 166mdaily  Pred Forte (prednisolone acetate 1%) 2 times daily OU  Dorzolamide TID OS  Timolol daily OU    Allergies:  Allergies   Allergen Reactions    Fish Hives     from [place]      Family history of uveitis: No    Social history:  Social History     Socioeconomic History    Marital status: SINGLE     Spouse name: Not on file    Number of children: Not on file    Years of education: Not on file    Highest education level: Not on file   Occupational History    Not on file   Tobacco Use    Smoking status: Not on file    Smokeless tobacco: Not on file   Substance and Sexual Activity    Alcohol use: Not on file    Drug use: Not on file    Sexual activity: Not on file       Syphilis/TB labs:  12/24/19 negative Quant gold, syphilis    Pre-IMT labs:  Lab Results   Component Value Date    WBC 7.4 03/17/2020    RBC 4.57 03/17/2020    HGB 14.3 03/17/2020    HCT 43.4 03/17/2020    MCV 95.0 03/17/2020    MCH 31.2 03/17/2020    MCHC 32.9 03/17/2020    RDW 14.5 03/17/2020    MPV 8.8 03/17/2020    PLT 168 03/17/2020       Hepatitis B Surface Antigen   Date Value Ref Range Status   03/17/2020 Nonreactive Nonreactive Final     HEPATITIS B CORE Ab, IgM   Date Value Ref Range Status   03/17/2020 Nonreactive Nonreactive Final     Hepatitis C Ab Screen   Date Value Ref Range Status   03/17/2020 Nonreactive Nonreactive Final     Aspartate Transaminase (AST)   Date Value Ref Range Status   03/17/2020 25 15 - 43 U/L Final     Alanine Transferase (ALT)   Date Value Ref Range Status   03/17/2020 33 6 - 63 U/L Final     Comment:     Reference intervals are not available for this population.  Male Reference Range5-54U/L  Male Reference Range6-63U/L         Urea Nitrogen, Blood (BUN)   Date Value Ref Range  Status   03/17/2020 16 8 - 22 mg/dL Final      Creatinine Serum   Date Value Ref Range Status   03/17/2020 1.25 0.44 - 1.27 mg/dL Final       IMT monitoring labs:  Lab Results   Component Value Date    WBC 7.4 03/17/2020    RBC 4.57 03/17/2020    HGB 14.3 03/17/2020    HCT 43.4 03/17/2020    MCV 95.0 03/17/2020    MCH 31.2 03/17/2020    MCHC 32.9 03/17/2020    RDW 14.5 03/17/2020    MPV 8.8 03/17/2020    PLT 168 03/17/2020       Aspartate Transaminase (AST)   Date Value Ref Range Status   03/17/2020 25 15 - 43 U/L Final   12/24/2019 24 15 - 43 U/L Final     Alanine Transferase (ALT)   Date Value Ref Range Status   03/17/2020 33 6 - 63 U/L Final     Comment:     Reference intervals are not available for this population.  Male Reference Range5-54U/L  Male Reference Range6-63U/L       12/24/2019 15 6 - 63 U/L Final     Urea Nitrogen, Blood (BUN)   Date Value Ref Range Status   03/17/2020 16 8 - 22 mg/dL Final   12/24/2019 13 8 - 22 mg/dL Final     Creatinine Serum   Date Value Ref Range Status   03/17/2020 1.25 0.44 - 1.27 mg/dL Final   12/24/2019 1.01 0.44 - 1.27 mg/dL Final       Eye Exam:  Base Eye Exam     Visual Acuity (Snellen - Linear)       Right Left    Dist sc 20/60 +2 20/80 +2    Dist ph sc 20/40 20/60 -2          Tonometry ([facility], 8:26 AM)       Right Left    Pressure 13 15          Pupils       Dark Shape    Right 3     Left 3.5 Irregular          Visual Fields (Counting fingers)       Right Left     Full Full          Extraocular Movement       Right Left     Full Full          Neuro/Psych     Oriented x3: Yes    Mood/Affect: Normal          Dilation     Both eyes: 1.0% Mydriacyl, 2.5% Phenylephrine @ 8:26 AM   Patient brought his/her own sunglasses.  Charma Igo, H[facility]             Slit Lamp and Fundus Exam     External Exam       Right Left    External Normal Normal          Slit Lamp Exam       Right Left    Lids/Lashes Normal Normal    Conjunctiva/Sclera White and Quiet White and Quiet    Cornea Clear corneal incision, no KP Clear corneal  incision, faint fine KP    Anterior Chamber 0.5+ cell 0.5+ cell    Iris Few TIDs, pharm dilated, no nodules Few TIDs, pharm dilated, no nodules    Lens PCIOL,  PC appears intact  PCIOL, open PC    Vitreous s/p vitrectomy, 1+ cells s/p vitrectomy, 1+ cells          Fundus Exam       Right Left    Disc cupping cupping    C/D Ratio 0.8 0.8    Macula attached attached    Vessels perivascular sclerosis and exudation perivascular sclerosis and exudation    Periphery inferior operculated holes x 2 attached              Data Reviewed:    OCT macula 02/18/2020:  OD: ERM, CME, mild  OS: ERM, CME, center-involving, worse than OD    Optos FA 02/18/20:  OU: Diffuse retinal leakage, disc leakage, macular leakage, and vasculitis    OCT macula 03/17/2020:  OD: ERM, CME resolved  OS: ERM, CME almost resolved     Optos FA 03/17/2020:  OD: improved nerve leakage and mildly improved peripheral leakage  OS: improved nerve leakage and around arcades; persistent peripheral leakage    OCT macula 04/07/2020:  OD: ERM, very tr CME  OS: ERM,  very tr CME    Optos 04/07/2020:  OU: attached, attenuated vessels (Stable)    Workup labs 12/24/19:  Negative: HIV, HCV, ACE, Quant Gold, Syphilis IgG/IgM, urine B2 microglobulin  Positive: HLA-B27    Chest XR: 12/24/19  IMPRESSION:  1. No acute cardiopulmonary disease    ASSESSMENT:    1. 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    2. Pseudophakia OU    3. Possible glaucoma OU  - states he had stents placed in both eyes at time of cataract  surgery. Discs with cupping. IOP seems well controlled on current regimen. Probably had some sort of MIGS (possible istent OU?). Does not have glaucoma tubes.    4. Operculated holes OD  - 2 inferior noted today, 04/07/2020    PLAN:  - Decrease oral prednisone (10 mg pills) to 2 pills a day until next visit   - Continue prednisolone acetate drop twice daily in both eyes  - Continue Folic acid, 1 pill once a day  - Continue Methotrexate 6 pills once a week   - Repeat labs today, if within normal limits, will increase to 10 pills once a week  - Follow up for MRI Brain  - Will also start adalimumab (approved) for noninfectious panuveitis once MRI Brain completed (awaiting authorization for MRI).  - May consider Ozurdex OU in future if okay with glaucoma service and if needed but will try to manage with IMT as his discs have cupping  - Continue Dorzolamide three times daily left eye  - Continue Timolol daily both eyes   - Referred to glaucoma service to establish care    - Follow-up in 4 weeks with OCT OU, Optos OU; sooner as needed. Obtain a good phone number for him when he returns next visit, his voicemail is full.    I confirm that I reviewed the patient's medical history and other pertinent data, and personally examined the patient with the ophthalmic exam  findings documented here.     I agree with the exam findings, treatment plan, and medical decision making reported by the resident or fellow, with any exceptions as noted above.    SCRIBE DISCLAIMER:   This note was scribed by Burt Knack, SCRIBE, a trained medical scribe in the presence of Dr. Donnita Falls, M.D.  Electronically signed by Burt Knack, SCRIBE, 04/07/2020 9:22 AM    PROVIDER DISCLAIMER:   This document serves as my personal record of services taken in my presence. It was created on 04/07/2020 on my behalf by the trained medical scribe listed above.     I have reviewed this document and agree that this note accurately reflects the history and exam  findings, the patient care provided, and my medical decision making.     Donnita Falls, MD

## 2020-04-08 ENCOUNTER — Other Ambulatory Visit: Payer: Self-pay

## 2020-04-08 ENCOUNTER — Telehealth: Payer: Self-pay | Admitting: OPHTHALMOLOGY

## 2020-04-08 NOTE — Telephone Encounter (Signed)
Called patient to inform of [facility] Referral being completed.    No answer and voicemail full, unable to lvm. Referral Approved, set to scheduling      Thank you,  Reather Littler  Medical Office Service Coordinator III  [facility] -Ophthalmology  [phone]  Fax:[phone]

## 2020-04-08 NOTE — Telephone Encounter (Signed)
Labs wnl.  Patient called to go up on MTX to 10 pills/weekly.  Patient expressed understanding.    Alphonzo Cruise MD  Retina Fellow  Pager [phone]

## 2020-04-09 ENCOUNTER — Other Ambulatory Visit: Payer: Self-pay

## 2020-04-10 ENCOUNTER — Other Ambulatory Visit: Payer: Self-pay

## 2020-04-15 ENCOUNTER — Telehealth: Payer: Self-pay | Admitting: OPHTHALMOLOGY

## 2020-04-15 NOTE — Telephone Encounter (Signed)
PA submitted for Pred Forte eyedrops on CoverMyMeds.    Key#: [id number]    Thanks,    Jasmine December  Retina AA

## 2020-04-16 ENCOUNTER — Other Ambulatory Visit: Payer: Self-pay

## 2020-04-17 ENCOUNTER — Other Ambulatory Visit: Payer: Self-pay

## 2020-04-22 ENCOUNTER — Other Ambulatory Visit: Payer: Self-pay

## 2020-05-04 NOTE — Progress Notes (Deleted)
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    Terry Stuart states he has been dealing with ocular problems off and on since around 20 years ago. He initially got a piece of glass in his eye in 1997 which required topical steroids. The steroids reportedly caused cataract and glaucoma for which he received extraction and an iStent. The history of the left eye is unclear, but he also developed inflammation at some point in this eye as well. Both eyes have undergone vitrectomy. He has required oral prednisone perhaps twice in the past.    He uses Pred Forte (prednisolone acetate 1%) and timolol off and on for flares. These usually manifest as redness, pain, foreign body sensation, and photophobia with later blurred vision.    He has been told many things in the past, including that he had Lyme disease or sarcoidosis, but neither of these diagnoses were ultimately confirmed.    Interval HPI 03/17/2020:  States he is using PO prednisone 40 mg since last Tuesday as directed. Reports has gained 15 pounds and is having mood swings. Has not made or heard from rhuematology. Has ran out of timolol OU and Dorz Tid OS for last 3 weeks. States will be out of town in [place] from 03/21/20 - 04/07/20.  Denies vision changes OU.    Interval history 04/07/2020:  States has not heard back from radiology regarding MRI Brain. States he is using PO prednisone 30 mg as directed; though only using 6 pills MTX; using PF BID. States experienced intermittent impotence and feels it may be due to the prednisone. Denies vision changes OU.    Interval history 05/05/2020:  ***    Uveitis ROS 02/18/2020:   Gen: No acute distress   Oral ulcers: No   Genital ulcers: No   Cold sores: No   Ringing in ears or hearing loss: No   Weight loss or gain: No   Numbness, tingling, weakness in extremities: No   Loss of bowel or bladder function: No   Diarrhea or blood in stool: Yes  - blood in the stool, was told hemorrhoids   Joint pains: Yes - gout in the toes, attributes these to playing many sports with repetitive minor injuries (knee, ankle, shoulder)   Skin rashes: No   Cats or dogs at home: Yes - dogs   Recent foreign travel: No   Sexually transmitted infection: No    Past Medical History 03/17/2020:  Denies any past medical history aside from uveitis, and denies taking any other medications other than the ones we prescribe.    Past Medical History:   Diagnosis Date    Iritis of left eye     chronic, recurrent       Medications:  Current Outpatient Medications   Medication Sig Dispense Refill    Adalimumab (HUMIRA) 40 mg/0.4 mL Pen Injector Kit Inject 0.4 mL (1 syringe) subcutaneously every other week. Perform subcutaneous injection every other week 2 each 11    adalimumab 80 mg/0.8 mL-40 mg/0.4 mL Pen Injector Kit As Directed. Inject 80mg  subcutaneous injection on day 1  then 40 mg subcutaneous injection on day 8 and then 40 mg every other week thereafter 3 each 0    Dorzolamide (TRUSOPT) 2 % Ophthalmic Solution Instill 1 drop into the LEFT eye 3 times daily. 5 mL 3    dorzolamide HCl (DORZOLAMIDE OPHTH) Instill into the affected eye(s).  One drop TID OS      Folic Acid 1 mg Tablet Take 1 tablet by mouth every day. 90 tablet 3    Methotrexate (RHEUMATREX) 2.5 mg Tablet Take 6 pills once a week for 2 weeks, then increase to 8 pills once a week after you have your labs checked. 120 tablet 0    Prednisolone Acetate (PRED FORTE, ECONOPRED PLUS) 1% Ophthalmic Suspension Put 1 drop in both eyes four times a day. 10 mL 5    PredniSONE (DELTASONE) 10 mg Tablet Take 1 tablet by mouth every morning with a meal. Take 3 pills once a day until follow up with Dr. Quintella Baton 200 tablet 0    Timolol (TIMOPTIC) 0.5% Ophthalmic Solution Instill 1 drop into EACH eye 2 times daily. 5 mL 0    Timolol (TIMOPTIC) 0.5% Ophthalmic Solution Instill 1 drop into EACH eye every day. 5 mL 3     No current  facility-administered medications for this visit.       Ocular meds (drops and systemic) 04/07/20:  Oral prednisone 30mg  daily  Methotrexate 15mg  PO weekly  Folic acid 1mg  daily  Pred Forte (prednisolone acetate 1%) 2 times daily OU  Dorzolamide TID OS  Timolol daily OU    Allergies:  Allergies   Allergen Reactions    Fish Hives     from [place]        Family history of uveitis: No    Social history:  Social History     Socioeconomic History    Marital status: SINGLE     Spouse name: Not on file    Number of children: Not on file    Years of education: Not on file    Highest education level: Not on file   Occupational History    Not on file   Tobacco Use    Smoking status: Not on file    Smokeless tobacco: Not on file   Substance and Sexual Activity    Alcohol use: Not on file    Drug use: Not on file    Sexual activity: Not on file       Syphilis/TB labs:  12/24/19 negative Quant gold, syphilis    Pre-IMT labs:  Lab Results   Component Value Date    WBC 13.0 (H) 04/07/2020    RBC 4.76 04/07/2020    HGB 14.6 04/07/2020    HCT 44.7 04/07/2020    MCV 93.9 04/07/2020    MCH 30.8 04/07/2020    MCHC 32.8 04/07/2020    RDW 14.4 04/07/2020    MPV 9.0 04/07/2020    PLT 223 04/07/2020       Hepatitis B Surface Antigen   Date Value Ref Range Status   03/17/2020 Nonreactive Nonreactive Final     HEPATITIS B CORE Ab, IgM   Date Value Ref Range Status   03/17/2020 Nonreactive Nonreactive Final     Hepatitis C Ab Screen   Date Value Ref Range Status   03/17/2020 Nonreactive Nonreactive Final     Aspartate Transaminase (AST)   Date Value Ref Range Status   04/07/2020 18 15 - 43 U/L Final     Alanine Transferase (ALT)   Date Value Ref Range Status   04/07/2020 18 6 - 63 U/L Final     Comment:     Reference intervals are not available for this population.  Male Reference Range5-54U/L  Male Reference Range6-63U/L  Urea Nitrogen, Blood (BUN)   Date Value Ref Range Status   04/07/2020 12 8 -  22 mg/dL Final     Creatinine Serum   Date Value Ref Range Status   04/07/2020 1.08 0.44 - 1.27 mg/dL Final       IMT monitoring labs:  Lab Results   Component Value Date    WBC 13.0 (H) 04/07/2020    RBC 4.76 04/07/2020    HGB 14.6 04/07/2020    HCT 44.7 04/07/2020    MCV 93.9 04/07/2020    MCH 30.8 04/07/2020    MCHC 32.8 04/07/2020    RDW 14.4 04/07/2020    MPV 9.0 04/07/2020    PLT 223 04/07/2020       Aspartate Transaminase (AST)   Date Value Ref Range Status   04/07/2020 18 15 - 43 U/L Final   03/17/2020 25 15 - 43 U/L Final   12/24/2019 24 15 - 43 U/L Final     Alanine Transferase (ALT)   Date Value Ref Range Status   04/07/2020 18 6 - 63 U/L Final     Comment:     Reference intervals are not available for this population.  Male Reference Range5-54U/L  Male Reference Range6-63U/L       03/17/2020 33 6 - 63 U/L Final     Comment:     Reference intervals are not available for this population.  Male Reference Range5-54U/L  Male Reference Range6-63U/L       12/24/2019 15 6 - 63 U/L Final     Urea Nitrogen, Blood (BUN)   Date Value Ref Range Status   04/07/2020 12 8 - 22 mg/dL Final   03/17/2020 16 8 - 22 mg/dL Final   12/24/2019 13 8 - 22 mg/dL Final     Creatinine Serum   Date Value Ref Range Status   04/07/2020 1.08 0.44 - 1.27 mg/dL Final   03/17/2020 1.25 0.44 - 1.27 mg/dL Final   12/24/2019 1.01 0.44 - 1.27 mg/dL Final       Eye Exam:  Not recorded       Data Reviewed:    OCT macula 02/18/2020:  OD: ERM, CME, mild  OS: ERM, CME, center-involving, worse than OD    Optos FA 02/18/20:  OU: Diffuse retinal leakage, disc leakage, macular leakage, and vasculitis    OCT macula 03/17/2020:  OD: ERM, CME resolved  OS: ERM, CME almost resolved     Optos FA 03/17/2020:  OD: improved nerve leakage and mildly improved peripheral leakage  OS: improved nerve leakage and around arcades; persistent peripheral leakage    OCT macula 04/07/2020:  OD: ERM, very tr CME  OS: ERM,  very tr CME    Optos 04/07/2020:  OU:  attached, attenuated vessels (Stable)    OCT macula 05/05/2020:  OD: ***  OS: ***     Optos 05/05/2020:  OD: ***  OS: ***     Workup labs 12/24/19:  Negative: HIV, HCV, ACE, Quant Gold, Syphilis IgG/IgM, urine B2 microglobulin  Positive: HLA-B27    Chest XR: 12/24/19  IMPRESSION:  1. No acute cardiopulmonary disease    ASSESSMENT:    1. 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    2. Pseudophakia OU    3. 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 Discs with cupping. IOP seems well controlled on current regimen. Probably had some sort of MIGS (possible istent OU?). Does not have glaucoma tubes.    4. Operculated holes OD  - 2 inferior noted today, 04/07/2020    PLAN:  - *** oral prednisone (10 mg pills) to 2 pills a day until next visit   - Continue prednisolone acetate drop twice daily in both eyes  - Continue Folic acid,1 pill once a day  - Continue Methotrexate 6 pills once a week   - Repeat labs today, if within normal limits, will increase to 10 pills once a week  - Follow up for MRI Brain  - Will also start adalimumab (approved) for noninfectious panuveitis once MRI Brain completed (awaiting authorization for MRI).  - May consider Ozurdex OU in future if okay with glaucoma service and if needed but will try to manage with IMT as his discs have cupping  - Continue Dorzolamide three times daily left eye  - Continue Timolol daily both eyes   - *** with glaucoma service     - Follow-up in 4 weeks with OCT OU,  Optos OU; sooner as needed. Obtain a good phone number for him when he returns next visit, his voicemail is full.    ***

## 2020-05-05 ENCOUNTER — Ambulatory Visit: Payer: MEDICAID | Admitting: OPHTHALMOLOGY

## 2020-05-05 DIAGNOSIS — H44113 Panuveitis, bilateral: Secondary | ICD-10-CM

## 2020-05-29 ENCOUNTER — Other Ambulatory Visit: Payer: Self-pay | Admitting: OPHTHALMOLOGY

## 2020-05-30 ENCOUNTER — Other Ambulatory Visit: Payer: Self-pay | Admitting: Student in an Organized Health Care Education/Training Program

## 2020-05-30 NOTE — Telephone Encounter (Signed)
Patient's medications reviewed and requested refill appears consistent from previous visit.      Refill for medications sent to pharmacy.    Evana Runnels, MD  PGY-3, Ophthalmology   [facility]

## 2020-06-01 NOTE — Progress Notes (Deleted)
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    Mr. Holderness states he has been dealing with ocular problems off and on since around 20 years ago. He initially got a piece of glass in his eye in 1997 which required topical steroids. The steroids reportedly caused cataract and glaucoma for which he received extraction and an iStent. The history of the left eye is unclear, but he also developed inflammation at some point in this eye as well. Both eyes have undergone vitrectomy. He has required oral prednisone perhaps twice in the past.    He uses Pred Forte (prednisolone acetate 1%) and timolol off and on for flares. These usually manifest as redness, pain, foreign body sensation, and photophobia with later blurred vision.    He has been told many things in the past, including that he had Lyme disease or sarcoidosis, but neither of these diagnoses were ultimately confirmed.    Interval HPI 03/17/2020:  States he is using PO prednisone 40 mg since last Tuesday as directed. Reports has gained 15 pounds and is having mood swings. Has not made or heard from rhuematology. Has ran out of timolol OU and Dorz Tid OS for last 3 weeks. States will be out of town in [place] from 03/21/20 - 04/07/20.  Denies vision changes OU.    Interval history 04/07/2020:  States has not heard back from radiology regarding MRI Brain. States he is using PO prednisone 30 mg as directed; though only using 6 pills MTX; using PF BID. States experienced intermittent impotence and feels it may be due to the prednisone. Denies vision changes OU.    Interval history 06/02/2020:  ***    Uveitis ROS 02/18/2020:   Gen: No acute distress   Oral ulcers: No   Genital ulcers: No   Cold sores: No   Ringing in ears or hearing loss: No   Weight loss or gain: No   Numbness, tingling, weakness in extremities: No   Loss of bowel or bladder function: No   Diarrhea or blood in stool: Yes  - blood in the stool, was told hemorrhoids   Joint pains: Yes - gout in the toes, attributes these to playing many sports with repetitive minor injuries (knee, ankle, shoulder)   Skin rashes: No   Cats or dogs at home: Yes - dogs   Recent foreign travel: No   Sexually transmitted infection: No    Past Medical History 03/17/2020:  Denies any past medical history aside from uveitis, and denies taking any other medications other than the ones we prescribe.    Ocular meds (drops and systemic) 06/02/2020:  Oral prednisone 20mg  daily  Methotrexate 15mg  PO weekly  Folic acid 1mg  daily  Pred Forte (prednisolone acetate 1%) 2 times daily OU  Dorzolamide TID OS  Timolol daily OU    Past Medical History:   Diagnosis Date    Iritis of left eye     chronic, recurrent       Medications:  Current Outpatient Medications   Medication Sig Dispense Refill    Adalimumab (HUMIRA) 40 mg/0.4 mL Pen Injector Kit Inject 0.4 mL (1 syringe) subcutaneously every other week. Perform subcutaneous injection every other week 2 each 11    adalimumab 80 mg/0.8 mL-40 mg/0.4 mL Pen Injector Kit As Directed. Inject 80mg  subcutaneous injection on day 1  then 40 mg subcutaneous injection on day 8 and then 40  mg every other week thereafter 3 each 0    Dorzolamide (TRUSOPT) 2 % Ophthalmic Solution Instill 1 drop into the LEFT eye 3 times daily. 5 mL 3    dorzolamide HCl (DORZOLAMIDE OPHTH) Instill into the affected eye(s). One drop TID OS      Folic Acid 1 mg Tablet Take 1 tablet by mouth every day. 90 tablet 3    Methotrexate (RHEUMATREX) 2.5 mg Tablet Take 6 pills once a week for 2 weeks, then increase to 8 pills once a week after you have your labs checked. 120 tablet 0    Prednisolone Acetate (PRED FORTE, ECONOPRED PLUS) 1% Ophthalmic Suspension Put 1 drop in both eyes four times a day. 10 mL 5    PredniSONE (DELTASONE) 10 mg Tablet Take 2 tablets by mouth every morning with a meal. Take 2 pills once a day until follow up with Dr. Quintella Baton  120 tablet 0    Timolol (TIMOPTIC) 0.5% Ophthalmic Solution Instill 1 drop into EACH eye 2 times daily. 5 mL 0    Timolol (TIMOPTIC) 0.5% Ophthalmic Solution Instill 1 drop into EACH eye every day. 5 mL 3     No current facility-administered medications for this visit.       Allergies:  Allergies   Allergen Reactions    Fish Hives     from [place]        Family history of uveitis: No    Social history:  Social History     Socioeconomic History    Marital status: SINGLE     Spouse name: Not on file    Number of children: Not on file    Years of education: Not on file    Highest education level: Not on file   Occupational History    Not on file   Tobacco Use    Smoking status: Not on file    Smokeless tobacco: Not on file   Substance and Sexual Activity    Alcohol use: Not on file    Drug use: Not on file    Sexual activity: Not on file       Syphilis/TB labs:  12/24/19 negative Quant gold, syphilis    Pre-IMT labs:  Lab Results   Component Value Date    WBC 13.0 (H) 04/07/2020    RBC 4.76 04/07/2020    HGB 14.6 04/07/2020    HCT 44.7 04/07/2020    MCV 93.9 04/07/2020    MCH 30.8 04/07/2020    MCHC 32.8 04/07/2020    RDW 14.4 04/07/2020    MPV 9.0 04/07/2020    PLT 223 04/07/2020       Hepatitis B Surface Antigen   Date Value Ref Range Status   03/17/2020 Nonreactive Nonreactive Final     HEPATITIS B CORE Ab, IgM   Date Value Ref Range Status   03/17/2020 Nonreactive Nonreactive Final     Hepatitis C Ab Screen   Date Value Ref Range Status   03/17/2020 Nonreactive Nonreactive Final     Aspartate Transaminase (AST)   Date Value Ref Range Status   04/07/2020 18 15 - 43 U/L Final     Alanine Transferase (ALT)   Date Value Ref Range Status   04/07/2020 18 6 - 63 U/L Final     Comment:     Reference intervals are not available for this population.  Male Reference Range5-54U/L  Male Reference Range6-63U/L  Urea Nitrogen, Blood (BUN)   Date Value Ref Range Status   04/07/2020 12  8 - 22 mg/dL Final     Creatinine Serum   Date Value Ref Range Status   04/07/2020 1.08 0.44 - 1.27 mg/dL Final       IMT monitoring labs:  Lab Results   Component Value Date    WBC 13.0 (H) 04/07/2020    RBC 4.76 04/07/2020    HGB 14.6 04/07/2020    HCT 44.7 04/07/2020    MCV 93.9 04/07/2020    MCH 30.8 04/07/2020    MCHC 32.8 04/07/2020    RDW 14.4 04/07/2020    MPV 9.0 04/07/2020    PLT 223 04/07/2020       Aspartate Transaminase (AST)   Date Value Ref Range Status   04/07/2020 18 15 - 43 U/L Final   03/17/2020 25 15 - 43 U/L Final   12/24/2019 24 15 - 43 U/L Final     Alanine Transferase (ALT)   Date Value Ref Range Status   04/07/2020 18 6 - 63 U/L Final     Comment:     Reference intervals are not available for this population.  Male Reference Range5-54U/L  Male Reference Range6-63U/L       03/17/2020 33 6 - 63 U/L Final     Comment:     Reference intervals are not available for this population.  Male Reference Range5-54U/L  Male Reference Range6-63U/L       12/24/2019 15 6 - 63 U/L Final     Urea Nitrogen, Blood (BUN)   Date Value Ref Range Status   04/07/2020 12 8 - 22 mg/dL Final   03/17/2020 16 8 - 22 mg/dL Final   12/24/2019 13 8 - 22 mg/dL Final     Creatinine Serum   Date Value Ref Range Status   04/07/2020 1.08 0.44 - 1.27 mg/dL Final   03/17/2020 1.25 0.44 - 1.27 mg/dL Final   12/24/2019 1.01 0.44 - 1.27 mg/dL Final       Eye Exam:  Not recorded       Data Reviewed:    OCT macula 02/18/2020:  OD: ERM, CME, mild  OS: ERM, CME, center-involving, worse than OD    Optos FA 02/18/20:  OU: Diffuse retinal leakage, disc leakage, macular leakage, and vasculitis    OCT macula 03/17/2020:  OD: ERM, CME resolved  OS: ERM, CME almost resolved     Optos FA 03/17/2020:  OD: improved nerve leakage and mildly improved peripheral leakage  OS: improved nerve leakage and around arcades; persistent peripheral leakage    OCT macula 04/07/2020:  OD: ERM, very tr CME  OS: ERM,  very tr CME    Optos 04/07/2020:  OU:  attached, attenuated vessels (Stable)    OCT macula 06/02/2020:  OD: ***  OS: ***     Optos 06/02/2020:  OD: ***  OS: ***     Workup labs 12/24/19:  Negative: HIV, HCV, ACE, Quant Gold, Syphilis IgG/IgM, urine B2 microglobulin  Positive: HLA-B27    Chest XR: 12/24/19  IMPRESSION:  1. No acute cardiopulmonary disease    ASSESSMENT:    1. 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    2. Pseudophakia OU    3. 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 Discs with cupping. IOP seems well controlled on current regimen. Probably had some sort of MIGS (possible istent OU?). Does not have glaucoma tubes.    4. Operculated holes OD  - 2 inferior noted today, 04/07/2020    PLAN:  - Decreased oral prednisone (10 mg pills) to 2 pills a day until next visit   - Continue prednisolone acetate drop twice daily in both eyes  - Continue Folic acid,1 pill once a day  - Continue Methotrexate 6 pills once a week   - Repeat labs today, if within normal limits, will increase to 10 pills once a week  - Follow up for MRI Brain  - Will also start adalimumab (approved) for noninfectious panuveitis once MRI Brain completed (awaiting authorization for MRI).  - May consider Ozurdex OU in future if okay with glaucoma service and if needed but will try to manage with IMT as his discs have cupping  - Continue Dorzolamide three times daily left eye  - Continue Timolol daily both eyes     - Follow-up in 4 weeks with OCT OU, Optos OU; sooner as  needed. Obtain a good phone number for him when he returns next visit, his voicemail is full.    ***

## 2020-06-02 ENCOUNTER — Ambulatory Visit: Payer: MEDICAID | Admitting: OPHTHALMOLOGY

## 2020-06-02 DIAGNOSIS — H44113 Panuveitis, bilateral: Secondary | ICD-10-CM

## 2020-06-03 ENCOUNTER — Encounter: Payer: Self-pay | Admitting: OPHTHALMOLOGY

## 2020-07-15 ENCOUNTER — Other Ambulatory Visit: Payer: Self-pay | Admitting: Retina Specialist

## 2020-07-29 ENCOUNTER — Other Ambulatory Visit: Payer: Self-pay | Admitting: Student in an Organized Health Care Education/Training Program

## 2021-05-05 ENCOUNTER — Telehealth: Payer: Self-pay | Admitting: OPHTHALMOLOGY

## 2021-05-05 NOTE — Telephone Encounter (Signed)
Received refill request from [facility] for Timolol 0.5%.  However, I see that the patient has not been seen in clinic for quite some time now.  Will forward to Dr. Dicie Beam to have her review to see if the patient should have this refilled.  Shelva Majestic, MA

## 2021-05-08 ENCOUNTER — Other Ambulatory Visit: Payer: Self-pay

## 2021-05-08 ENCOUNTER — Encounter (HOSPITAL_COMMUNITY): Payer: Self-pay

## 2021-05-08 ENCOUNTER — Emergency Department (HOSPITAL_COMMUNITY): Payer: Medicaid - Out of State

## 2021-05-08 ENCOUNTER — Emergency Department (HOSPITAL_COMMUNITY)
Admission: EM | Admit: 2021-05-08 | Discharge: 2021-05-08 | Disposition: A | Discharge status: Home / Self Care | Payer: Medicaid - Out of State | Attending: Emergency Medicine | Admitting: Emergency Medicine

## 2021-05-08 DIAGNOSIS — X500XXA Overexertion from strenuous movement or load, initial encounter: Secondary | ICD-10-CM | POA: Insufficient documentation

## 2021-05-08 DIAGNOSIS — Y9289 Other specified places as the place of occurrence of the external cause: Secondary | ICD-10-CM | POA: Insufficient documentation

## 2021-05-08 DIAGNOSIS — Y93F2 Activity, caregiving, lifting: Secondary | ICD-10-CM | POA: Diagnosis not present

## 2021-05-08 DIAGNOSIS — Y990 Civilian activity done for income or pay: Secondary | ICD-10-CM | POA: Insufficient documentation

## 2021-05-08 DIAGNOSIS — M25511 Pain in right shoulder: Secondary | ICD-10-CM | POA: Diagnosis not present

## 2021-05-08 DIAGNOSIS — S29012A Strain of muscle and tendon of back wall of thorax, initial encounter: Secondary | ICD-10-CM | POA: Diagnosis not present

## 2021-05-08 DIAGNOSIS — S29002A Unspecified injury of muscle and tendon of back wall of thorax, initial encounter: Secondary | ICD-10-CM | POA: Diagnosis present

## 2021-05-08 HISTORY — DX: Unspecified osteoarthritis, unspecified site: M19.90

## 2021-05-08 MED ORDER — METHOCARBAMOL 500 MG PO TABS
1000.0000 mg | ORAL_TABLET | Freq: Once | ORAL | Status: AC
  Administered 2021-05-08: 1000 mg via ORAL
  Filled 2021-05-08: qty 2

## 2021-05-08 MED ORDER — NAPROXEN 250 MG PO TABS
500.0000 mg | ORAL_TABLET | Freq: Once | ORAL | Status: DC
  Filled 2021-05-08: qty 2

## 2021-05-08 MED ORDER — METHOCARBAMOL 500 MG PO TABS: 500.0000 mg | ORAL_TABLET | Freq: Three times a day (TID) | ORAL | 0 refills | Status: AC | PRN

## 2021-05-08 MED ORDER — KETOROLAC TROMETHAMINE 30 MG/ML IJ SOLN
30.0000 mg | Freq: Once | INTRAMUSCULAR | Status: AC
  Administered 2021-05-08: 30 mg via INTRAMUSCULAR
  Filled 2021-05-08: qty 1

## 2021-05-08 NOTE — Discharge Instructions (Signed)
You were evaluated in the Emergency Department and after careful evaluation, we did not find any emergent condition requiring admission or further testing in the hospital.  Your exam/testing today was overall reassuring.  X-ray did not show any collapsed lung or emergencies.  Suspect pain due to muscle strain or spasm.  Recommend Tylenol or Motrin at home for discomfort.  Can use the Robaxin medication for more significant pain.  Please return to the Emergency Department if you experience any worsening of your condition.  Thank you for allowing [place] to be a part of your care.

## 2021-05-08 NOTE — ED Notes (Signed)
Patient transported to X-ray 

## 2021-05-08 NOTE — ED Provider Notes (Signed)
[facility] Emergency Department Provider Note MRN:  [id number]  Arrival date & time: 05/08/21     Chief Complaint   Back Pain (Right side thoracic region)   History of Present Illness   Glenn Scott is a 51 y.o. year-old male with no previous medical presenting to the ED with chief complaint of back pain.  Pain to the right paraspinal thoracic musculature as well as the right shoulder blade area.  Started suddenly while lifting weights, initially was mild but has become more severe over the past day or 2.  Worse with deep breath.  Review of Systems  A thorough review of systems was obtained and all systems are negative except as noted in the HPI and PMH.   Patient's Health History    Past Medical History:  Diagnosis Date   Arthritis     Past Surgical History:  Procedure Laterality Date   EYE SURGERY Bilateral     No family history on file.  Social History   Socioeconomic History   Marital status: Single    Spouse name: Not on file   Number of children: Not on file   Years of education: Not on file   Highest education level: Not on file  Occupational History   Not on file  Tobacco Use   Smoking status: Never   Smokeless tobacco: Not on file  Vaping Use   Vaping Use: Never used  Substance and Sexual Activity   Alcohol use: No   Drug use: Never   Sexual activity: Not on file  Other Topics Concern   Not on file  Social History Narrative   Not on file   Social Determinants of Health   Financial Resource Strain: Not on file  Food Insecurity: Not on file  Transportation Needs: Not on file  Physical Activity: Not on file  Stress: Not on file  Social Connections: Not on file  Intimate Partner Violence: Not on file     Physical Exam   Vitals:   05/08/21 0510 05/08/21 0600  BP: (!) 133/96 (!) 138/102  Pulse: 75 68  Resp: 17 18  Temp: 98.5 F (36.9 C) 98.2 F (36.8 C)  SpO2: 96% 96%    CONSTITUTIONAL: Well-appearing,  NAD NEURO/PSYCH:  Alert and oriented x 3, no focal deficits EYES:  eyes equal and reactive ENT/NECK:  no LAD, no JVD CARDIO: Regular rate, well-perfused, normal S1 and S2 PULM:  CTAB no wheezing or rhonchi GI/GU:  non-distended, non-tender MSK/SPINE:  No gross deformities, no edema; tender to palpation to the right thoracic back SKIN:  no rash, atraumatic   *Additional and/or pertinent findings included in MDM below  Diagnostic and Interventional Summary    EKG Interpretation  Date/Time:    Ventricular Rate:    PR Interval:    QRS Duration:   QT Interval:    QTC Calculation:   R Axis:     Text Interpretation:         Labs Reviewed - No data to display  DG Chest 2 View  Final Result      Medications  methocarbamol (ROBAXIN) tablet 1,000 mg (1,000 mg Oral Given 05/08/21 0534)  ketorolac (TORADOL) 30 MG/ML injection 30 mg (30 mg Intramuscular Given 05/08/21 0539)     Procedures  /  Critical Care Procedures  ED Course and Medical Decision Making  Initial Impression and Ddx Reproducible pain with palpation on exam, highly suspect MSK pain.  Will obtain screening x-ray to exclude pneumothorax.  No evidence of DVT, no tachycardia, no hypoxia, highly doubt PE.  Past medical/surgical history that increases complexity of ED encounter: None  Interpretation of Diagnostics I personally reviewed the Chest Xray and my interpretation is as follows: No pneumothorax      Patient Reassessment and Ultimate Disposition/Management Discharge home  Patient management required discussion with the following services or consulting groups:  None  Complexity of Problems Addressed Acute complicated illness or Injury  Additional Data Reviewed and Analyzed Further history obtained from: Past medical history and medications listed in the EMR  Factors Impacting ED Encounter Risk Prescriptions  Barth Kirks. [place] Small, [place] [email or web address]  Final Clinical Impressions(s) / ED Diagnoses     ICD-10-CM   1. Strain of thoracic back region  S29.012A       ED Discharge Orders          Ordered    methocarbamol (ROBAXIN) 500 MG tablet  Every 8 hours PRN        05/08/21 0621             Discharge Instructions Discussed with and Provided to Patient:     Discharge Instructions      You were evaluated in the Emergency Department and after careful evaluation, we did not find any emergent condition requiring admission or further testing in the hospital.  Your exam/testing today was overall reassuring.  X-ray did not show any collapsed lung or emergencies.  Suspect pain due to muscle strain or spasm.  Recommend Tylenol or Motrin at home for discomfort.  Can use the Robaxin medication for more significant pain.  Please return to the Emergency Department if you experience any worsening of your condition.  Thank you for allowing [place] to be a part of your care.        Maudie Flakes, MD 05/08/21 [phone]

## 2021-05-08 NOTE — ED Triage Notes (Signed)
Pt presents with right side thoracic back pain, that started a couple of days ago after working out, pain has gotten progressively worse and now hurts with deep breaths.

## 2021-05-13 NOTE — Telephone Encounter (Signed)
Routing to appointment center.  Shelva Majestic, MA

## 2021-05-21 ENCOUNTER — Telehealth: Payer: Self-pay | Admitting: OPHTHALMOLOGY

## 2021-05-21 NOTE — Telephone Encounter (Signed)
Attempt to call pt phone number is no longer in service. Pt needs new referral/auth sent from pcp to be scheduled for appt.

## 2021-05-23 ENCOUNTER — Other Ambulatory Visit: Payer: Self-pay

## 2021-05-23 ENCOUNTER — Emergency Department (HOSPITAL_BASED_OUTPATIENT_CLINIC_OR_DEPARTMENT_OTHER)
Admission: EM | Admit: 2021-05-23 | Discharge: 2021-05-23 | Disposition: A | Discharge status: Home / Self Care | Payer: Medicaid - Out of State | Attending: Emergency Medicine | Admitting: Emergency Medicine

## 2021-05-23 ENCOUNTER — Encounter (HOSPITAL_BASED_OUTPATIENT_CLINIC_OR_DEPARTMENT_OTHER): Payer: Self-pay | Admitting: Emergency Medicine

## 2021-05-23 DIAGNOSIS — M25521 Pain in right elbow: Secondary | ICD-10-CM | POA: Insufficient documentation

## 2021-05-23 DIAGNOSIS — Z79899 Other long term (current) drug therapy: Secondary | ICD-10-CM | POA: Insufficient documentation

## 2021-05-23 DIAGNOSIS — R0789 Other chest pain: Secondary | ICD-10-CM

## 2021-05-23 MED ORDER — KETOROLAC TROMETHAMINE 15 MG/ML IJ SOLN
15.0000 mg | Freq: Once | INTRAMUSCULAR | Status: AC
  Administered 2021-05-23: 15 mg via INTRAMUSCULAR
  Filled 2021-05-23: qty 1

## 2021-05-23 MED ORDER — OXYCODONE HCL 5 MG PO TABS
5.0000 mg | ORAL_TABLET | Freq: Once | ORAL | Status: AC
  Administered 2021-05-23: 5 mg via ORAL
  Filled 2021-05-23: qty 1

## 2021-05-23 MED ORDER — ACETAMINOPHEN 500 MG PO TABS
1000.0000 mg | ORAL_TABLET | Freq: Once | ORAL | Status: AC
Start: 2021-05-23 — End: 2021-05-23
  Administered 2021-05-23: 1000 mg via ORAL
  Filled 2021-05-23: qty 2

## 2021-05-23 MED ORDER — DIAZEPAM 5 MG PO TABS
5.0000 mg | ORAL_TABLET | Freq: Once | ORAL | Status: AC
  Administered 2021-05-23: 5 mg via ORAL
  Filled 2021-05-23: qty 1

## 2021-05-23 NOTE — ED Provider Notes (Signed)
[facility] EMERGENCY DEPT Provider Note   CSN: [id number] Arrival date & time: 05/23/21  0300     History  Chief Complaint  Patient presents with   Rib Injury    Glenn Scott is a 51 y.o. male.  51 yo M with right sided chest wall pain after seeing a chiropractor. Worse with movement, palpation, twisting.  Denies overt trauma.  Pain with inspiration but no significant difficulty breathing.  R elbow pain as well.        Home Medications Prior to Admission medications   Medication Sig Start Date End Date Taking? Authorizing Provider  ibuprofen (ADVIL,MOTRIN) 200 MG tablet Take 400-600 mg by mouth every 6 (six) hours as needed. For pain    [provider]  methocarbamol (ROBAXIN) 500 MG tablet Take 1 tablet (500 mg total) by mouth every 8 (eight) hours as needed for muscle spasms. 05/08/21   Sabas Sous, MD  sulfamethoxazole-trimethoprim (SEPTRA DS) 800-160 MG per tablet Take 1 tablet by mouth 2 (two) times daily. For 10 days 01/05/12   Pauline Aus, PA-C      Allergies    Fish allergy, Other, and Shellfish allergy    Review of Systems   Review of Systems  Physical Exam Updated Vital Signs BP (!) 156/113 (BP Location: Left Arm)    Pulse 82    Temp 98.1 F (36.7 C) (Oral)    Resp 18    Ht 5\' 7"  (1.702 m)    Wt 77.1 kg    SpO2 97%    BMI 26.63 kg/m  Physical Exam Vitals and nursing note reviewed.  Constitutional:      Appearance: He is well-developed.  HENT:     Head: Normocephalic and atraumatic.  Eyes:     Pupils: Pupils are equal, round, and reactive to light.  Neck:     Vascular: No JVD.  Cardiovascular:     Rate and Rhythm: Normal rate and regular rhythm.     Heart sounds: No murmur heard.   No friction rub. No gallop.  Pulmonary:     Effort: No respiratory distress.     Breath sounds: No wheezing.  Abdominal:     General: There is no distension.     Tenderness: There is no abdominal tenderness. There is no guarding or  rebound.  Musculoskeletal:        General: Normal range of motion.     Cervical back: Normal range of motion and neck supple.     Comments: Tenderness about the trapezius.  Pain to the right elbow without obvious edema.  Able to supinate and pronate.  PMS intact distally.   Skin:    Coloration: Skin is not pale.     Findings: No rash.  Neurological:     Mental Status: He is alert and oriented to person, place, and time.  Psychiatric:        Behavior: Behavior normal.    ED Results / Procedures / Treatments   Labs (all labs ordered are listed, but only abnormal results are displayed) Labs Reviewed - No data to display  EKG None  Radiology No results found.  Procedures Procedures    Medications Ordered in ED Medications  ketorolac (TORADOL) 15 MG/ML injection 15 mg (15 mg Intramuscular Given 05/23/21 0330)  acetaminophen (TYLENOL) tablet 1,000 mg (1,000 mg Oral Given 05/23/21 0330)  oxyCODONE (Oxy IR/ROXICODONE) immediate release tablet 5 mg (5 mg Oral Given 05/23/21 0330)  diazepam (VALIUM) tablet 5 mg (5 mg  Oral Given 05/23/21 0330)    ED Course/ Medical Decision Making/ A&P                           Medical Decision Making Risk OTC drugs. Prescription drug management.   51 yo M with a cc of pain after having chiropractic manipulation of his chest wall.  Likely muscular spasm.  I feel utility of xray to be so low as to not outweigh the risk of radiation.  Will treat supportively.  PCP follow-up.   3:37 AM:  I have discussed the diagnosis/risks/treatment options with the patient and family.  Evaluation and diagnostic testing in the emergency department does not suggest an emergent condition requiring admission or immediate intervention beyond what has been performed at this time.  They will follow up with  PCP. We also discussed returning to the ED immediately if new or worsening sx occur. We discussed the sx which are most concerning (e.g., sudden worsening pain, fever,  inability to tolerate by mouth) that necessitate immediate return. Medications administered to the patient during their visit and any new prescriptions provided to the patient are listed below.  Medications given during this visit Medications  ketorolac (TORADOL) 15 MG/ML injection 15 mg (15 mg Intramuscular Given 05/23/21 0330)  acetaminophen (TYLENOL) tablet 1,000 mg (1,000 mg Oral Given 05/23/21 0330)  oxyCODONE (Oxy IR/ROXICODONE) immediate release tablet 5 mg (5 mg Oral Given 05/23/21 0330)  diazepam (VALIUM) tablet 5 mg (5 mg Oral Given 05/23/21 0330)     The patient appears reasonably screen and/or stabilized for discharge and I doubt any other medical condition or other [facility] requiring further screening, evaluation, or treatment in the ED at this time prior to discharge.         Final Clinical Impression(s) / ED Diagnoses Final diagnoses:  Chest wall pain    Rx / DC Orders ED Discharge Orders     None         Melene Plan, DO 05/23/21 [id number]

## 2021-05-23 NOTE — ED Notes (Signed)
Patient verbalizes understanding of discharge instructions. Opportunities for questioning and answers were provided. Patient discharged from ED to home.  °

## 2021-05-23 NOTE — ED Triage Notes (Signed)
°  Patient comes in with R sided rib pain that started 2 days ago.  Patient states he had a dislocated rib and was readjusted by chiropractor 2 days ago.  Patient states since he saw chiropractor he has had swelling and pain in same area.  Pain 7/10, dull pain.

## 2021-05-23 NOTE — Discharge Instructions (Signed)
Take 4 over the counter ibuprofen tablets 3 times a day or 2 over-the-counter naproxen tablets twice a day for pain. Also take tylenol 1000mg (2 extra strength) four times a day.    Use the sling for comfort.  Follow up with your family doc.

## 2021-07-27 ENCOUNTER — Encounter: Payer: Self-pay | Admitting: Physician Assistant

## 2022-07-18 IMAGING — DX DG CHEST 2V
2 series · 2 of 2 positions shown · non-contrast
Comparison: Chest CT 11/28/2007

CLINICAL DATA: Assess for pneumothorax. States right-sided chest
pain following a workout. Pain with deep inspiration.

EXAM:
CHEST - 2 VIEW

[chest pa]
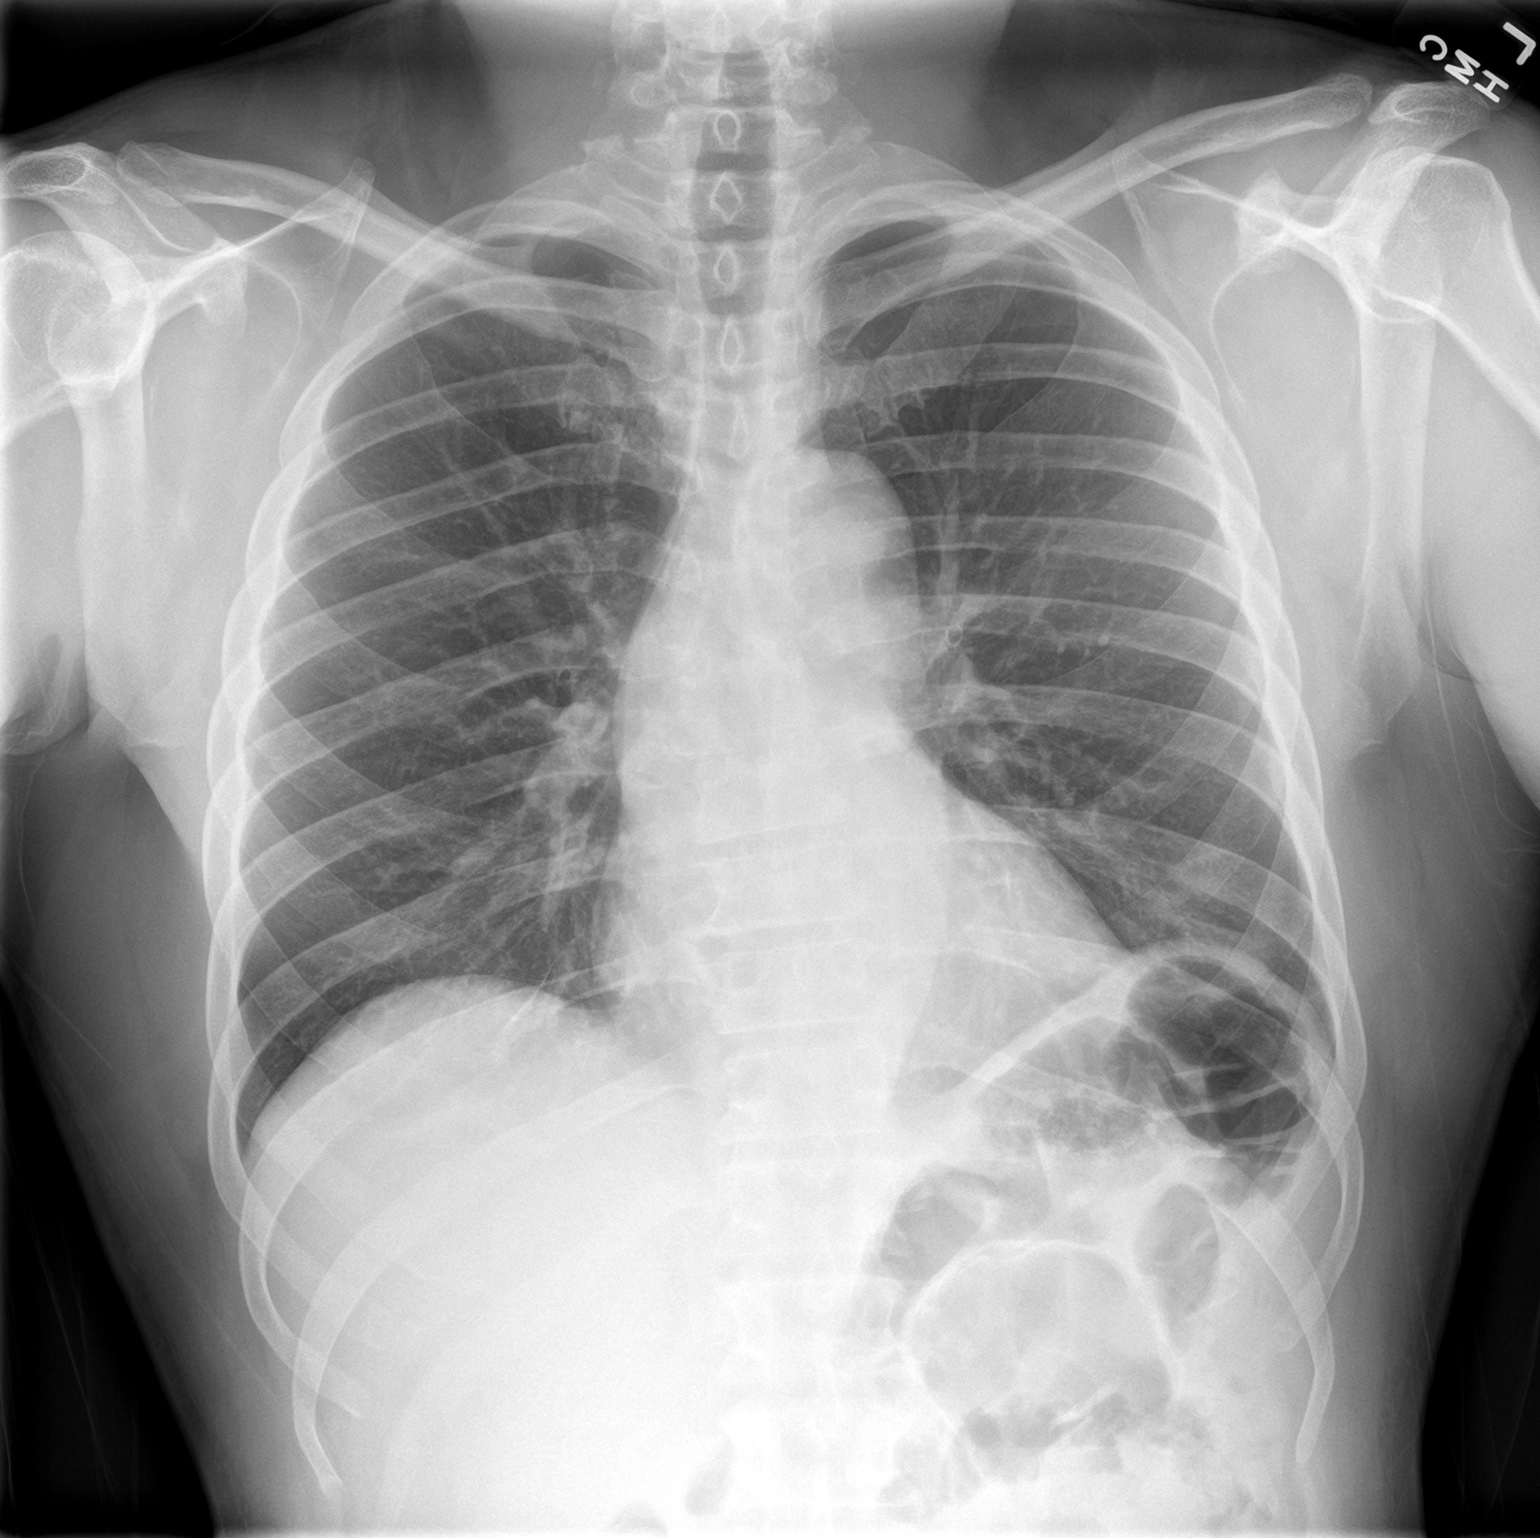

[chest lat]
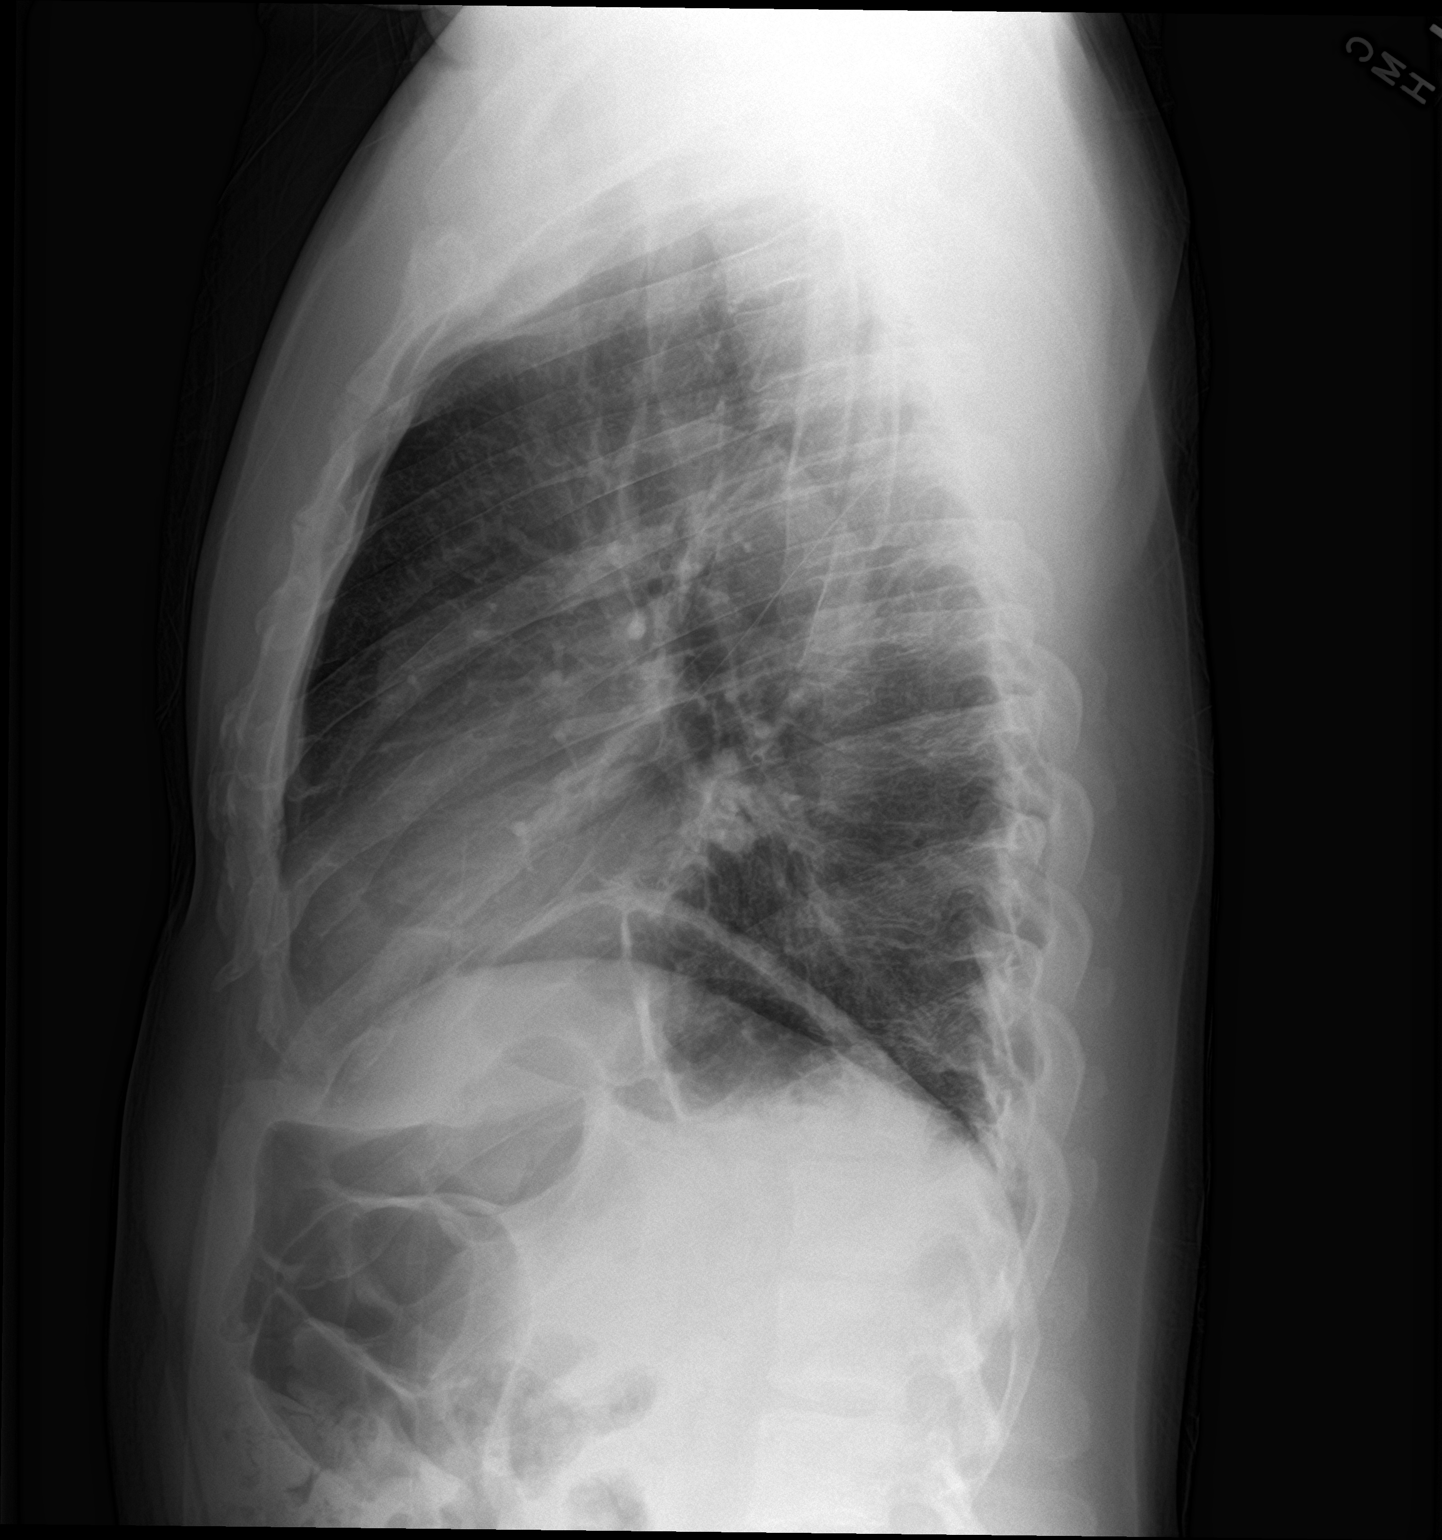

[2 of 2 positions shown; findings below may reference images not displayed]

FINDINGS: The heart size and mediastinal contours are within normal limits.
Both lungs are clear. No pneumothorax is seen. The visualized
skeletal structures are unremarkable. There is no visible displaced
rib fracture.
IMPRESSION: No evidence acute chest disease.
